# Patient Record
Sex: Female | Born: 1975 | Race: White | Hispanic: No | Marital: Married | Smoking: Never smoker
Health system: Southern US, Community
[De-identification: ages and names within clinical notes are randomized; demographics above are authoritative.]

## PROBLEM LIST (undated history)

## (undated) DIAGNOSIS — C801 Malignant (primary) neoplasm, unspecified: Secondary | ICD-10-CM

## (undated) DIAGNOSIS — J189 Pneumonia, unspecified organism: Secondary | ICD-10-CM

## (undated) DIAGNOSIS — F419 Anxiety disorder, unspecified: Secondary | ICD-10-CM

## (undated) DIAGNOSIS — E039 Hypothyroidism, unspecified: Secondary | ICD-10-CM

## (undated) DIAGNOSIS — D229 Melanocytic nevi, unspecified: Secondary | ICD-10-CM

## (undated) DIAGNOSIS — M1990 Unspecified osteoarthritis, unspecified site: Secondary | ICD-10-CM

## (undated) HISTORY — PX: SLEEVE GASTROPLASTY: SHX1101

## (undated) HISTORY — PX: ABDOMINAL HYSTERECTOMY: SHX81

## (undated) HISTORY — PX: CHOLECYSTECTOMY: SHX55

## (undated) HISTORY — PX: TENDON REPAIR: SHX5111

## (undated) HISTORY — PX: I & D EXTREMITY: SHX5045

## (undated) HISTORY — PX: NECK SURGERY: SHX720

## (undated) HISTORY — PX: HERNIA REPAIR: SHX51

## (undated) HISTORY — PX: GASTRIC BYPASS: SHX52

---
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
## 2002-04-28 DIAGNOSIS — D229 Melanocytic nevi, unspecified: Secondary | ICD-10-CM

## 2002-04-28 HISTORY — DX: Melanocytic nevi, unspecified: D22.9

## 2010-11-06 ENCOUNTER — Other Ambulatory Visit: Payer: Self-pay | Admitting: Rheumatology

## 2010-11-06 ENCOUNTER — Ambulatory Visit (HOSPITAL_COMMUNITY)
Admission: RE | Admit: 2010-11-06 | Discharge: 2010-11-06 | Disposition: A | Discharge status: Home / Self Care | Payer: Medicare (Managed Care) | Source: Ambulatory Visit | Attending: Rheumatology | Admitting: Rheumatology

## 2010-11-06 DIAGNOSIS — R52 Pain, unspecified: Secondary | ICD-10-CM

## 2019-05-02 ENCOUNTER — Encounter: Payer: Self-pay | Admitting: *Deleted

## 2020-10-31 ENCOUNTER — Ambulatory Visit: Payer: Medicare (Managed Care) | Admitting: Physician Assistant

## 2020-11-01 ENCOUNTER — Ambulatory Visit: Payer: Medicare (Managed Care) | Admitting: Physician Assistant

## 2020-11-06 ENCOUNTER — Ambulatory Visit: Payer: Medicare (Managed Care) | Admitting: Physician Assistant

## 2020-12-26 ENCOUNTER — Encounter (HOSPITAL_COMMUNITY): Payer: Self-pay | Admitting: Orthopedic Surgery

## 2020-12-26 NOTE — H&P (Signed)
TOTAL KNEE ADMISSION H&P  Patient is being admitted for right total knee arthroplasty.  Subjective:  Chief Complaint: Right knee pain.  HPI: Isabella Moore, 45 y.o. female has a history of pain and functional disability in the right knee due to arthritis and has failed non-surgical conservative treatments for greater than 12 weeks to include corticosteriod injections and activity modification. Onset of symptoms was gradual, starting >10 years ago with gradually worsening course since that time. The patient noted no past surgery on the right knee.  Patient currently rates pain in the right knee at 7 out of 10 with activity. Patient has worsening of pain with activity and weight bearing, pain that interferes with activities of daily living, pain with passive range of motion and crepitus. Patient has evidence of bone-on-bone arthritis in the medial compartment and near bone-on-bone in the patellofemoral compartment by imaging studies. There is no active infection.  There are no problems to display for this patient.   Past Medical History:  Diagnosis Date  . 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 Atypical nevus 05/15/2011   moderate-lower back  . Atypical nevus 06/23/2013   mild- upper right back  . Atypical nevus 06/23/2013   moderate-lower right back,lower stomach  . Atypical nevus 08/02/2014   mild-left jawline, right back, left mid back, left lower mid back  . Atypical nevus 03/14/2015   moderate-left inner thigh  . Atypical nevus 12/13/2015   mild-right side superior  . Atypical nevus 07/30/2016   moderate-lower abdomen    History reviewed. No pertinent surgical history.  Prior to Admission medications   Medication Sig Start Date End Date Taking? Authorizing Provider  Calcium Carbonate (CALCI-CHEW PO) Take 1 tablet by mouth in the morning and at bedtime.   Yes [provider]   Cyanocobalamin (B-12 PO) Place 1 drop under the tongue in the morning and at bedtime.   Yes [provider]  diphenhydrAMINE (BENADRYL) 25 mg capsule Take 25 mg by mouth every 6 (six) hours as needed for allergies.   Yes [provider]  DULoxetine (CYMBALTA) 60 MG capsule Take 60 mg by mouth every morning. 11/27/20  Yes [provider]  furosemide (LASIX) 80 MG tablet Take 80 mg by mouth 2 (two) times daily. 12/07/20  Yes [provider]  ibuprofen (ADVIL) 800 MG tablet Take 800 mg by mouth 2 (two) times daily as needed for pain. 12/10/20  Yes [provider]  Multiple Vitamins-Minerals (BARIATRIC FUSION PO) Take 1 tablet by mouth in the morning and at bedtime.   Yes [provider]  PARoxetine (PAXIL) 40 MG tablet Take 10 mg by mouth at bedtime. 0.25 tablet   Yes [provider]  pregabalin (LYRICA) 50 MG capsule Take 50 mg by mouth 2 (two) times daily. 11/24/20  Yes [provider]  promethazine (PHENERGAN) 25 MG tablet Take 25 mg by mouth every 6 (six) hours as needed for nausea/vomiting. 08/03/20  Yes [provider]  Pyridoxine HCl (B-6 PO) Place 1 drop under the tongue in the morning and at bedtime.   Yes [provider]  rOPINIRole (REQUIP) 2 MG tablet Take 2 mg by mouth in the morning, at noon, and at bedtime. 08/06/20  Yes [provider]  spironolactone (ALDACTONE) 50 MG tablet Take 50 mg by mouth in the morning. 12/01/20  Yes [provider]  tiZANidine (ZANAFLEX) 4 MG tablet Take 4 mg  by mouth in the morning, at noon, and at bedtime. 11/21/20  Yes [provider]  traMADol (ULTRAM) 50 MG tablet Take 50 mg by mouth every 6 (six) hours as needed (pain).   Yes [provider]  LINZESS 290 MCG CAPS capsule Take 290 mcg by mouth in the morning. 11/04/20   [provider]  ropinirole (REQUIP) 5 MG tablet Take 5 mg by mouth in the morning, at noon, and at bedtime. 12/08/20    [provider]    Allergies  Allergen Reactions  . Codeine Hives, Nausea And Vomiting, Rash and Other (See Comments)    hallucinations   . Latex Hives and Rash  . Morphine Hives, Rash and Other (See Comments)    hallucinations   . Pork Allergy Anaphylaxis  . Ondansetron Other (See Comments)    Severe headaches   . Wound Dressing Adhesive Rash    Social History   Socioeconomic History  . Marital status: Married    Spouse name: Not on file  . Number of children: Not on file  . Years of education: Not on file  . Highest education level: Not on file  Occupational History  . Not on file  Tobacco Use  . Smoking status: Not on file  . Smokeless tobacco: Not on file  Substance and Sexual Activity  . Alcohol use: Not on file  . Drug use: Not on file  . Sexual activity: Not on file  Other Topics Concern  . Not on file  Social History Narrative  . Not on file   Social Determinants of Health   Financial Resource Strain: Not on file  Food Insecurity: Not on file  Transportation Needs: Not on file  Physical Activity: Not on file  Stress: Not on file  Social Connections: Not on file  Intimate Partner Violence: Not on file    Tobacco Use: Not on file   Social History   Substance and Sexual Activity  Alcohol Use None    History reviewed. No pertinent family history.  Review of Systems  Constitutional: Negative for chills and fever.  HENT: Negative for congestion, sore throat and tinnitus.   Eyes: Negative for double vision, photophobia and pain.  Respiratory: Negative for cough, shortness of breath and wheezing.   Cardiovascular: Negative for chest pain, palpitations and orthopnea.  Gastrointestinal: Negative for heartburn, nausea and vomiting.  Genitourinary: Negative for dysuria, frequency and urgency.  Musculoskeletal: Positive for joint pain.  Neurological: Negative for dizziness, weakness and headaches.    Objective:  Physical Exam: Well  nourished and well developed.  General: Alert and oriented x3, cooperative and pleasant, no acute distress.  Head: normocephalic, atraumatic, neck supple.  Eyes: EOMI.  Respiratory: breath sounds clear in all fields, no wheezing, rales, or rhonchi. Cardiovascular: Regular rate and rhythm, no murmurs, gallops or rubs.  Abdomen: non-tender to palpation and soft, normoactive bowel sounds. Musculoskeletal:  Right Knee Exam:  Trace effusion present. No swelling present.  The range of motion is: 5 to 110 degrees actively, I can push her further than 110 degrees, but she does have significant pain with it.  No crepitus on range of motion of the knee.  Slight medial joint line tenderness.  No lateral joint line tenderness.  The knee is stable.   Calves soft and nontender. Motor function intact in LE. Strength 5/5 LE bilaterally. Neuro: Distal pulses 2+. Sensation to light touch intact in LE.  Imaging Review Plain radiographs demonstrate severe degenerative joint disease of the  right knee. The overall alignment is neutral. The bone quality appears to be adequate for age and reported activity level.  Assessment/Plan:  End stage arthritis, right knee   The patient history, physical examination, clinical judgment of the provider and imaging studies are consistent with end stage degenerative joint disease of the right knee and total knee arthroplasty is deemed medically necessary. The treatment options including medical management, injection therapy arthroscopy and arthroplasty were discussed at length. The risks and benefits of total knee arthroplasty were presented and reviewed. The risks due to aseptic loosening, infection, stiffness, patella tracking problems, thromboembolic complications and other imponderables were discussed. The patient acknowledged the explanation, agreed to proceed with the plan and consent was signed. Patient is being admitted for inpatient treatment for surgery, pain  control, PT, OT, prophylactic antibiotics, VTE prophylaxis, progressive ambulation and ADLs and discharge planning. The patient is planning to be discharged home.   Patient's anticipated LOS is less than 2 midnights, meeting these requirements: - Younger than 19 - Lives within 1 hour of care - Has a competent adult at home to recover with post-op recover - NO history of  - Chronic pain requiring opiods  - Diabetes  - Coronary Artery Disease  - Heart failure  - Heart attack  - Stroke  - DVT/VTE  - Cardiac arrhythmia  - Respiratory Failure/COPD  - Renal failure  - Anemia  - Advanced Liver disease  Therapy Plans: Outpatient therapy at [facility] Disposition: Home with husband Planned DVT Prophylaxis: Xarelto 10 mg QD DME Needed: Gil[facility] Rile PCP: Allie Dimmer, MD (clearance received) TXA: IV Allergies: Codeine (hives), morphine (angioedema, hallucinations), zofran (headache) Anesthesia Concerns: None BMI: 32.3 Last HgbA1c: Not diabetic [facility]acy: [facility] [place])  Other:  - Tolerates tramadol and dilaudid - Hx gastric bypass   - Patient was instructed on what medications to stop prior to surgery. - Follow-up visit in 2 weeks with Dr. Wynelle Link - Begin physical therapy following surgery - Pre-operative lab work as pre-surgical testing - Prescriptions will be provided in hospital at time of discharge  Theresa Duty, PA-C Orthopedic Surgery [facility] [place]

## 2020-12-27 NOTE — Patient Instructions (Signed)
DUE TO COVID-19 ONLY ONE VISITOR IS ALLOWED TO COME WITH YOU AND STAY IN THE WAITING ROOM ONLY DURING PRE OP AND PROCEDURE DAY OF SURGERY. THE 1 VISITOR  MAY VISIT WITH YOU AFTER SURGERY IN YOUR PRIVATE ROOM DURING VISITING HOURS ONLY!  YOU NEED TO HAVE A COVID 19 TEST ON: 12/28/20 @ 11:35 AM, THIS TEST MUST BE DONE BEFORE SURGERY,  COVID TESTING SITE [place] [address], IT IS ON THE RIGHT GOING OUT [address] APPROXIMATELY  2 MINUTES PAST [employer] ON THE RIGHT. ONCE YOUR COVID TEST IS COMPLETED,  PLEASE BEGIN THE QUARANTINE INSTRUCTIONS AS OUTLINED IN YOUR HANDOUT.                Isabella Moore   Your procedure is scheduled on: 12/31/20    Report to [facility] Main  Entrance   Report to admitting at: 12:20 PM     Call this number if you have problems the morning of surgery [phone]    Remember:  NO SOLID FOOD AFTER MIDNIGHT THE NIGHT PRIOR TO SURGERY. NOTHING BY MOUTH EXCEPT CLEAR LIQUIDS UNTIL: 11:50 AM . PLEASE FINISH ENSURE DRINK PER SURGEON ORDER  WHICH NEEDS TO BE COMPLETED AT: 11:50 AM .  CLEAR LIQUID DIET  Foods Allowed                                                                     Foods Excluded  Coffee and tea, regular and decaf                             liquids that you cannot  Plain Jell-O any favor except red or purple                                           see through such as: Fruit ices (not with fruit pulp)                                     milk, soups, orange juice  Iced Popsicles                                    All solid food Carbonated beverages, regular and diet                                    Cranberry, grape and apple juices Sports drinks like Gatorade Lightly seasoned clear broth or consume(fat free) Sugar, honey syrup  Sample Menu Breakfast                                Lunch  Supper Cranberry juice                    Beef broth                             Chicken broth Jell-O                                     Grape juice                           Apple juice Coffee or tea                        Jell-O                                      Popsicle                                                Coffee or tea                        Coffee or tea  _____________________________________________________________________   BRUSH YOUR TEETH MORNING OF SURGERY AND RINSE YOUR MOUTH OUT, NO CHEWING GUM CANDY OR MINTS.    Take these medicines the morning of surgery with A SIP OF WATER: Duloxetine,pregabalin,[place].                               You may not have any metal on your body including hair pins and              piercings  Do not wear jewelry, make-up, lotions, powders or perfumes, deodorant             Do not wear nail polish on your fingernails.  Do not shave  48 hours prior to surgery.    Do not bring valuables to the hospital. [place].  Contacts, dentures or bridgework may not be worn into surgery.  Leave suitcase in the car. After surgery it may be brought to your room.    Patients discharged the day of surgery will not be allowed to drive home. IF YOU ARE HAVING SURGERY AND GOING HOME THE SAME DAY, YOU MUST HAVE AN ADULT TO DRIVE YOU HOME AND BE WITH YOU FOR 24 HOURS. YOU MAY GO HOME BY TAXI OR UBER OR ORTHERWISE, BUT AN ADULT MUST ACCOMPANY YOU HOME AND STAY WITH YOU FOR 24 HOURS.  Name and phone number of your driver:  Special Instructions: N/A              Please read over the following fact sheets you were given: _____________________________________________________________________          [facility] - Preparing for Surgery Before surgery, you can play an important role.  Because skin is not sterile, your skin needs to be as free of germs as possible.  You can  reduce the number of germs on your skin by washing with CHG (chlorahexidine gluconate) soap before surgery.  CHG is an  antiseptic cleaner which kills germs and bonds with the skin to continue killing germs even after washing. Please DO NOT use if you have an allergy to CHG or antibacterial soaps.  If your skin becomes reddened/irritated stop using the CHG and inform your nurse when you arrive at Short Stay. Do not shave (including legs and underarms) for at least 48 hours prior to the first CHG shower.  You may shave your face/neck. Please follow these instructions carefully:  1.  Shower with CHG Soap the night before surgery and the  morning of Surgery.  2.  If you choose to wash your hair, wash your hair first as usual with your  normal  shampoo.  3.  After you shampoo, rinse your hair and body thoroughly to remove the  shampoo.                           4.  Use CHG as you would any other liquid soap.  You can apply chg directly  to the skin and wash                       Gently with a scrungie or clean washcloth.  5.  Apply the CHG Soap to your body ONLY FROM THE NECK DOWN.   Do not use on face/ open                           Wound or open sores. Avoid contact with eyes, ears mouth and genitals (private parts).                       Wash face,  Genitals (private parts) with your normal soap.             6.  Wash thoroughly, paying special attention to the area where your surgery  will be performed.  7.  Thoroughly rinse your body with warm water from the neck down.  8.  DO NOT shower/wash with your normal soap after using and rinsing off  the CHG Soap.                9.  Pat yourself dry with a clean towel.            10.  Wear clean pajamas.            11.  Place clean sheets on your bed the night of your first shower and do not  sleep with pets. Day of Surgery : Do not apply any lotions/deodorants the morning of surgery.  Please wear clean clothes to the hospital/surgery center.  FAILURE TO FOLLOW THESE INSTRUCTIONS MAY RESULT IN THE CANCELLATION OF YOUR SURGERY PATIENT  SIGNATURE_________________________________  NURSE SIGNATURE__________________________________  ________________________________________________________________________   Isabella Moore  An incentive spirometer is a tool that can help keep your lungs clear and active. This tool measures how well you are filling your lungs with each breath. Taking long deep breaths may help reverse or decrease the chance of developing breathing (pulmonary) problems (especially infection) following:  A long period of time when you are unable to move or be active. BEFORE THE PROCEDURE   If the spirometer includes an indicator to show your best effort, your nurse or respiratory therapist will set it  to a desired goal.  If possible, sit up straight or lean slightly forward. Try not to slouch.  Hold the incentive spirometer in an upright position. INSTRUCTIONS FOR USE  1. Sit on the edge of your bed if possible, or sit up as far as you can in bed or on a chair. 2. Hold the incentive spirometer in an upright position. 3. Breathe out normally. 4. Place the mouthpiece in your mouth and seal your lips tightly around it. 5. Breathe in slowly and as deeply as possible, raising the piston or the ball toward the top of the column. 6. Hold your breath for 3-5 seconds or for as long as possible. Allow the piston or ball to fall to the bottom of the column. 7. Remove the mouthpiece from your mouth and breathe out normally. 8. Rest for a few seconds and repeat Steps 1 through 7 at least 10 times every 1-2 hours when you are awake. Take your time and take a few normal breaths between deep breaths. 9. The spirometer may include an indicator to show your best effort. Use the indicator as a goal to work toward during each repetition. 10. After each set of 10 deep breaths, practice coughing to be sure your lungs are clear. If you have an incision (the cut made at the time of surgery), support your incision when coughing  by placing a pillow or rolled up towels firmly against it. Once you are able to get out of bed, walk around indoors and cough well. You may stop using the incentive spirometer when instructed by your caregiver.  RISKS AND COMPLICATIONS  Take your time so you do not get dizzy or light-headed.  If you are in pain, you may need to take or ask for pain medication before doing incentive spirometry. It is harder to take a deep breath if you are having pain. AFTER USE  Rest and breathe slowly and easily.  It can be helpful to keep track of a log of your progress. Your caregiver can provide you with a simple table to help with this. If you are using the spirometer at home, follow these instructions: [place] IF:   You are having difficultly using the spirometer.  You have trouble using the spirometer as often as instructed.  Your pain medication is not giving enough relief while using the spirometer.  You develop fever of 100.5 F (38.1 C) or higher. SEEK IMMEDIATE MEDICAL CARE IF:   You cough up bloody sputum that had not been present before.  You develop fever of 102 F (38.9 C) or greater.  You develop worsening pain at or near the incision site. MAKE SURE YOU:   Understand these instructions.  Will watch your condition.  Will get help right away if you are not doing well or get worse. Document Released: 02/02/2007 Document Revised: 12/15/2011 Document Reviewed: 04/05/2007 [facility] Patient Information 2014 [place], [place].   ________________________________________________________________________

## 2020-12-28 ENCOUNTER — Other Ambulatory Visit (HOSPITAL_COMMUNITY)
Admission: RE | Admit: 2020-12-28 | Discharge: 2020-12-28 | Disposition: A | Discharge status: Home / Self Care | Payer: Medicare HMO | Source: Ambulatory Visit | Attending: Orthopedic Surgery | Admitting: Orthopedic Surgery

## 2020-12-28 ENCOUNTER — Encounter (HOSPITAL_COMMUNITY): Payer: Self-pay

## 2020-12-28 ENCOUNTER — Encounter (HOSPITAL_COMMUNITY)
Admission: RE | Admit: 2020-12-28 | Discharge: 2020-12-28 | Disposition: A | Discharge status: Home / Self Care | Payer: Medicare HMO | Source: Ambulatory Visit | Attending: Orthopedic Surgery | Admitting: Orthopedic Surgery

## 2020-12-28 ENCOUNTER — Other Ambulatory Visit: Payer: Self-pay

## 2020-12-28 DIAGNOSIS — Z01812 Encounter for preprocedural laboratory examination: Secondary | ICD-10-CM | POA: Diagnosis not present

## 2020-12-28 DIAGNOSIS — Z20822 Contact with and (suspected) exposure to covid-19: Secondary | ICD-10-CM | POA: Diagnosis not present

## 2020-12-28 HISTORY — DX: Anxiety disorder, unspecified: F41.9

## 2020-12-28 HISTORY — DX: Hypothyroidism, unspecified: E03.9

## 2020-12-28 HISTORY — DX: Pneumonia, unspecified organism: J18.9

## 2020-12-28 HISTORY — DX: Malignant (primary) neoplasm, unspecified: C80.1

## 2020-12-28 HISTORY — DX: Unspecified osteoarthritis, unspecified site: M19.90

## 2020-12-28 LAB — CBC
HCT: 36.8 % (ref 36.0–46.0)
Hemoglobin: 11.6 g/dL — ABNORMAL LOW (ref 12.0–15.0)
MCH: 28 pg (ref 26.0–34.0)
MCHC: 31.5 g/dL (ref 30.0–36.0)
MCV: 88.7 fL (ref 80.0–100.0)
Platelets: 284 10*3/uL (ref 150–400)
RBC: 4.15 MIL/uL (ref 3.87–5.11)
RDW: 13.8 % (ref 11.5–15.5)
WBC: 9.4 10*3/uL (ref 4.0–10.5)
nRBC: 0 % (ref 0.0–0.2)

## 2020-12-28 LAB — COMPREHENSIVE METABOLIC PANEL
ALT: 17 U/L (ref 0–44)
AST: 18 U/L (ref 15–41)
Albumin: 3.9 g/dL (ref 3.5–5.0)
Alkaline Phosphatase: 63 U/L (ref 38–126)
Anion gap: 9 (ref 5–15)
BUN: 9 mg/dL (ref 6–20)
CO2: 30 mmol/L (ref 22–32)
Calcium: 8.7 mg/dL — ABNORMAL LOW (ref 8.9–10.3)
Chloride: 99 mmol/L (ref 98–111)
Creatinine, Ser: 0.89 mg/dL (ref 0.44–1.00)
GFR, Estimated: >60 mL/min (ref >60)
Glucose, Bld: 97 mg/dL (ref 70–99)
Potassium: 3.3 mmol/L — ABNORMAL LOW (ref 3.5–5.1)
Sodium: 138 mmol/L (ref 135–145)
Total Bilirubin: 0.5 mg/dL (ref 0.3–1.2)
Total Protein: 7.2 g/dL (ref 6.5–8.1)

## 2020-12-28 LAB — SARS CORONAVIRUS 2 (TAT 6-24 HRS): SARS Coronavirus 2: NEGATIVE

## 2020-12-28 LAB — SURGICAL PCR SCREEN
MRSA, PCR: POSITIVE — AB
Staphylococcus aureus: POSITIVE — AB

## 2020-12-28 LAB — PROTIME-INR
INR: 1 (ref 0.8–1.2)
Prothrombin Time: 12.5 seconds (ref 11.4–15.2)

## 2020-12-28 LAB — APTT: aPTT: 31 seconds (ref 24–36)

## 2020-12-28 NOTE — Progress Notes (Signed)
PCR: POSITIVE : MRSA

## 2020-12-28 NOTE — Progress Notes (Signed)
COVID Vaccine Completed: NO Date COVID Vaccine completed: COVID vaccine manufacturer: Anthony   PCP - Dr. Allie Dimmer: clearance: 12/25/20: Chart Cardiologist -   Chest x-ray -  EKG - 12/26/20: Chart Stress Test -  ECHO -  Cardiac Cath -  Pacemaker/ICD device last checked:  Sleep Study - Yes CPAP - No  Fasting Blood Sugar -  Checks Blood Sugar _____ times a day  Blood Thinner Instructions: Aspirin Instructions: Last Dose:  Anesthesia review:   Patient denies shortness of breath, fever, cough and chest pain at PAT appointment   Patient verbalized understanding of instructions that were given to them at the PAT appointment. Patient was also instructed that they will need to review over the PAT instructions again at home before surgery.

## 2020-12-30 MED ORDER — BUPIVACAINE LIPOSOME 1.3 % IJ SUSP
20.0000 mL | INTRAMUSCULAR | Status: DC
  Filled 2020-12-30: qty 20

## 2020-12-30 MED ORDER — VANCOMYCIN HCL IN DEXTROSE 1-5 GM/200ML-% IV SOLN
1000.0000 mg | INTRAVENOUS | Status: AC
  Administered 2020-12-31: 1000 mg via INTRAVENOUS
  Filled 2020-12-30: qty 200

## 2020-12-31 ENCOUNTER — Ambulatory Visit (HOSPITAL_COMMUNITY): Payer: Medicare HMO | Admitting: Anesthesiology

## 2020-12-31 ENCOUNTER — Observation Stay (HOSPITAL_COMMUNITY)
Admission: RE | Admit: 2020-12-31 | Discharge: 2021-01-01 | Disposition: A | Discharge status: Home / Self Care | Payer: Medicare HMO | Attending: Orthopedic Surgery | Admitting: Orthopedic Surgery

## 2020-12-31 ENCOUNTER — Other Ambulatory Visit: Payer: Self-pay

## 2020-12-31 ENCOUNTER — Encounter (HOSPITAL_COMMUNITY): Payer: Self-pay | Admitting: Orthopedic Surgery

## 2020-12-31 ENCOUNTER — Encounter (HOSPITAL_COMMUNITY)
Admission: RE | Disposition: A | Discharge status: Home / Self Care | Payer: Self-pay | Source: Home / Self Care | Attending: Orthopedic Surgery

## 2020-12-31 DIAGNOSIS — E039 Hypothyroidism, unspecified: Secondary | ICD-10-CM | POA: Insufficient documentation

## 2020-12-31 DIAGNOSIS — M1711 Unilateral primary osteoarthritis, right knee: Principal | ICD-10-CM | POA: Diagnosis present

## 2020-12-31 DIAGNOSIS — M179 Osteoarthritis of knee, unspecified: Secondary | ICD-10-CM | POA: Diagnosis present

## 2020-12-31 DIAGNOSIS — Z85828 Personal history of other malignant neoplasm of skin: Secondary | ICD-10-CM | POA: Diagnosis not present

## 2020-12-31 DIAGNOSIS — Z79899 Other long term (current) drug therapy: Secondary | ICD-10-CM | POA: Insufficient documentation

## 2020-12-31 DIAGNOSIS — M1710 Unilateral primary osteoarthritis, unspecified knee: Secondary | ICD-10-CM | POA: Diagnosis present

## 2020-12-31 HISTORY — PX: TOTAL KNEE ARTHROPLASTY: SHX125

## 2020-12-31 LAB — TYPE AND SCREEN
ABO/RH(D): A POS
Antibody Screen: NEGATIVE

## 2020-12-31 LAB — ABO/RH: ABO/RH(D): A POS

## 2020-12-31 SURGERY — ARTHROPLASTY, KNEE, TOTAL
Anesthesia: Spinal | Site: Knee | Laterality: Right

## 2020-12-31 MED ORDER — POLYETHYLENE GLYCOL 3350 17 G PO PACK: 17.0000 g | PACK | Freq: Every day | ORAL | Status: DC | PRN

## 2020-12-31 MED ORDER — MIDAZOLAM HCL 5 MG/5ML IJ SOLN
INTRAMUSCULAR | Status: DC | PRN
  Administered 2020-12-31 (×2): 2 mg via INTRAVENOUS

## 2020-12-31 MED ORDER — FENTANYL CITRATE (PF) 100 MCG/2ML IJ SOLN
25.0000 ug | INTRAMUSCULAR | Status: DC | PRN
  Administered 2020-12-31 (×3): 25 ug via INTRAVENOUS
  Administered 2020-12-31: 50 ug via INTRAVENOUS

## 2020-12-31 MED ORDER — TRANEXAMIC ACID-NACL 1000-0.7 MG/100ML-% IV SOLN
1000.0000 mg | INTRAVENOUS | Status: AC
  Administered 2020-12-31: 1000 mg via INTRAVENOUS
  Filled 2020-12-31: qty 100

## 2020-12-31 MED ORDER — BUPIVACAINE LIPOSOME 1.3 % IJ SUSP
INTRAMUSCULAR | Status: DC | PRN
  Administered 2020-12-31: 20 mL

## 2020-12-31 MED ORDER — HYDROMORPHONE HCL 1 MG/ML IJ SOLN: 0.5000 mg | INTRAMUSCULAR | Status: DC | PRN

## 2020-12-31 MED ORDER — CEFAZOLIN SODIUM-DEXTROSE 2-4 GM/100ML-% IV SOLN: 2.0000 g | Freq: Once | INTRAVENOUS | Status: DC

## 2020-12-31 MED ORDER — HYDROMORPHONE HCL 2 MG PO TABS
2.0000 mg | ORAL_TABLET | ORAL | Status: DC | PRN
  Administered 2020-12-31: 4 mg via ORAL
  Administered 2020-12-31: 2 mg via ORAL
  Administered 2021-01-01 (×3): 4 mg via ORAL
  Filled 2020-12-31: qty 2
  Filled 2020-12-31: qty 1
  Filled 2020-12-31 (×3): qty 2

## 2020-12-31 MED ORDER — CLONIDINE HCL (ANALGESIA) 100 MCG/ML EP SOLN
EPIDURAL | Status: DC | PRN
  Administered 2020-12-31: 100 ug

## 2020-12-31 MED ORDER — METOCLOPRAMIDE HCL 5 MG PO TABS: 5.0000 mg | ORAL_TABLET | Freq: Three times a day (TID) | ORAL | Status: DC | PRN

## 2020-12-31 MED ORDER — ACETAMINOPHEN 500 MG PO TABS
1000.0000 mg | ORAL_TABLET | Freq: Four times a day (QID) | ORAL | Status: DC
  Administered 2020-12-31 – 2021-01-01 (×3): 1000 mg via ORAL
  Filled 2020-12-31 (×3): qty 2

## 2020-12-31 MED ORDER — ACETAMINOPHEN 10 MG/ML IV SOLN
1000.0000 mg | Freq: Four times a day (QID) | INTRAVENOUS | Status: DC
  Administered 2020-12-31: 1000 mg via INTRAVENOUS
  Filled 2020-12-31: qty 100

## 2020-12-31 MED ORDER — PREGABALIN 50 MG PO CAPS
50.0000 mg | ORAL_CAPSULE | Freq: Two times a day (BID) | ORAL | Status: DC
  Administered 2020-12-31 – 2021-01-01 (×2): 50 mg via ORAL
  Filled 2020-12-31 (×2): qty 1

## 2020-12-31 MED ORDER — ROPINIROLE HCL 1 MG PO TABS
2.0000 mg | ORAL_TABLET | Freq: Three times a day (TID) | ORAL | Status: DC
Start: 2020-12-31 — End: 2021-01-01
  Administered 2020-12-31 – 2021-01-01 (×3): 2 mg via ORAL
  Filled 2020-12-31 (×3): qty 2

## 2020-12-31 MED ORDER — METHOCARBAMOL 500 MG IVPB - SIMPLE MED
INTRAVENOUS | Status: AC
  Administered 2020-12-31: 500 mg via INTRAVENOUS
  Filled 2020-12-31: qty 50

## 2020-12-31 MED ORDER — OXYCODONE HCL 5 MG PO TABS: 5.0000 mg | ORAL_TABLET | Freq: Once | ORAL | Status: DC | PRN

## 2020-12-31 MED ORDER — METOCLOPRAMIDE HCL 5 MG/ML IJ SOLN
5.0000 mg | Freq: Three times a day (TID) | INTRAMUSCULAR | Status: DC | PRN
  Administered 2020-12-31: 10 mg via INTRAVENOUS
  Filled 2020-12-31: qty 2

## 2020-12-31 MED ORDER — ORAL CARE MOUTH RINSE: 15.0000 mL | Freq: Once | OROMUCOSAL | Status: AC

## 2020-12-31 MED ORDER — METHOCARBAMOL 500 MG IVPB - SIMPLE MED
500.0000 mg | Freq: Four times a day (QID) | INTRAVENOUS | Status: DC | PRN
  Filled 2020-12-31: qty 50

## 2020-12-31 MED ORDER — ROPIVACAINE HCL 7.5 MG/ML IJ SOLN
INTRAMUSCULAR | Status: DC | PRN
  Administered 2020-12-31: 20 mL via PERINEURAL

## 2020-12-31 MED ORDER — CHLORHEXIDINE GLUCONATE 0.12 % MT SOLN
15.0000 mL | Freq: Once | OROMUCOSAL | Status: AC
  Administered 2020-12-31: 15 mL via OROMUCOSAL

## 2020-12-31 MED ORDER — TIZANIDINE HCL 4 MG PO TABS
4.0000 mg | ORAL_TABLET | Freq: Four times a day (QID) | ORAL | Status: DC | PRN
  Administered 2021-01-01: 4 mg via ORAL
  Filled 2020-12-31: qty 1

## 2020-12-31 MED ORDER — DULOXETINE HCL 60 MG PO CPEP
60.0000 mg | ORAL_CAPSULE | Freq: Every morning | ORAL | Status: DC
  Administered 2021-01-01: 60 mg via ORAL
  Filled 2020-12-31: qty 1

## 2020-12-31 MED ORDER — MENTHOL 3 MG MT LOZG: 1.0000 | LOZENGE | OROMUCOSAL | Status: DC | PRN

## 2020-12-31 MED ORDER — CEFAZOLIN SODIUM-DEXTROSE 2-4 GM/100ML-% IV SOLN
2.0000 g | Freq: Four times a day (QID) | INTRAVENOUS | Status: AC
Start: 2020-12-31 — End: 2021-01-01
  Administered 2020-12-31 – 2021-01-01 (×2): 2 g via INTRAVENOUS
  Filled 2020-12-31 (×2): qty 100

## 2020-12-31 MED ORDER — FENTANYL CITRATE (PF) 100 MCG/2ML IJ SOLN
INTRAMUSCULAR | Status: AC
  Filled 2020-12-31: qty 2

## 2020-12-31 MED ORDER — PROPOFOL 10 MG/ML IV BOLUS
INTRAVENOUS | Status: AC
  Filled 2020-12-31: qty 20

## 2020-12-31 MED ORDER — MIDAZOLAM HCL 2 MG/2ML IJ SOLN
1.0000 mg | INTRAMUSCULAR | Status: DC
  Administered 2020-12-31: 2 mg via INTRAVENOUS
  Filled 2020-12-31: qty 2

## 2020-12-31 MED ORDER — LACTATED RINGERS IV SOLN: INTRAVENOUS | Status: DC

## 2020-12-31 MED ORDER — OXYCODONE HCL 5 MG/5ML PO SOLN: 5.0000 mg | Freq: Once | ORAL | Status: DC | PRN

## 2020-12-31 MED ORDER — FENTANYL CITRATE (PF) 100 MCG/2ML IJ SOLN
INTRAMUSCULAR | Status: DC | PRN
  Administered 2020-12-31 (×2): 50 ug via INTRAVENOUS

## 2020-12-31 MED ORDER — FENTANYL CITRATE (PF) 100 MCG/2ML IJ SOLN
50.0000 ug | Freq: Once | INTRAMUSCULAR | Status: AC
  Administered 2020-12-31: 100 ug via INTRAVENOUS
  Filled 2020-12-31: qty 2

## 2020-12-31 MED ORDER — FLEET ENEMA 7-19 GM/118ML RE ENEM: 1.0000 | ENEMA | Freq: Once | RECTAL | Status: DC | PRN

## 2020-12-31 MED ORDER — SODIUM CHLORIDE 0.9 % IV SOLN: INTRAVENOUS | Status: DC

## 2020-12-31 MED ORDER — PROMETHAZINE HCL 25 MG/ML IJ SOLN: 6.2500 mg | INTRAMUSCULAR | Status: DC | PRN

## 2020-12-31 MED ORDER — TRAMADOL HCL 50 MG PO TABS
50.0000 mg | ORAL_TABLET | Freq: Four times a day (QID) | ORAL | Status: DC | PRN
  Administered 2020-12-31 – 2021-01-01 (×3): 50 mg via ORAL
  Filled 2020-12-31 (×3): qty 1

## 2020-12-31 MED ORDER — PHENOL 1.4 % MT LIQD: 1.0000 | OROMUCOSAL | Status: DC | PRN

## 2020-12-31 MED ORDER — MIDAZOLAM HCL 2 MG/2ML IJ SOLN
INTRAMUSCULAR | Status: AC
  Filled 2020-12-31: qty 2

## 2020-12-31 MED ORDER — PROMETHAZINE HCL 25 MG PO TABS: 25.0000 mg | ORAL_TABLET | Freq: Four times a day (QID) | ORAL | Status: DC | PRN

## 2020-12-31 MED ORDER — DIPHENHYDRAMINE HCL 25 MG PO CAPS
25.0000 mg | ORAL_CAPSULE | Freq: Four times a day (QID) | ORAL | Status: DC | PRN
  Administered 2021-01-01: 25 mg via ORAL
  Filled 2020-12-31: qty 1

## 2020-12-31 MED ORDER — SODIUM CHLORIDE (PF) 0.9 % IJ SOLN
INTRAMUSCULAR | Status: DC | PRN
  Administered 2020-12-31: 60 mL

## 2020-12-31 MED ORDER — BISACODYL 10 MG RE SUPP: 10.0000 mg | Freq: Every day | RECTAL | Status: DC | PRN

## 2020-12-31 MED ORDER — PROPOFOL 10 MG/ML IV BOLUS
INTRAVENOUS | Status: DC | PRN
Start: 2020-12-31 — End: 2020-12-31
  Administered 2020-12-31: 20 mg via INTRAVENOUS
  Administered 2020-12-31: 30 mg via INTRAVENOUS

## 2020-12-31 MED ORDER — DOCUSATE SODIUM 100 MG PO CAPS
100.0000 mg | ORAL_CAPSULE | Freq: Two times a day (BID) | ORAL | Status: DC
  Administered 2020-12-31 – 2021-01-01 (×2): 100 mg via ORAL
  Filled 2020-12-31 (×2): qty 1

## 2020-12-31 MED ORDER — DIPHENHYDRAMINE HCL 12.5 MG/5ML PO ELIX: 12.5000 mg | ORAL_SOLUTION | ORAL | Status: DC | PRN

## 2020-12-31 MED ORDER — SODIUM CHLORIDE 0.9 % IR SOLN
Status: DC | PRN
  Administered 2020-12-31: 3000 mL

## 2020-12-31 MED ORDER — PHENYLEPHRINE 40 MCG/ML (10ML) SYRINGE FOR IV PUSH (FOR BLOOD PRESSURE SUPPORT)
PREFILLED_SYRINGE | INTRAVENOUS | Status: DC | PRN
  Administered 2020-12-31 (×2): 120 ug via INTRAVENOUS

## 2020-12-31 MED ORDER — CEFAZOLIN SODIUM-DEXTROSE 2-4 GM/100ML-% IV SOLN
2.0000 g | INTRAVENOUS | Status: AC
  Administered 2020-12-31: 2 g via INTRAVENOUS
  Filled 2020-12-31: qty 100

## 2020-12-31 MED ORDER — BUPIVACAINE IN DEXTROSE 0.75-8.25 % IT SOLN
INTRATHECAL | Status: DC | PRN
  Administered 2020-12-31: 1.5 mL via INTRATHECAL

## 2020-12-31 MED ORDER — PHENYLEPHRINE 40 MCG/ML (10ML) SYRINGE FOR IV PUSH (FOR BLOOD PRESSURE SUPPORT)
PREFILLED_SYRINGE | INTRAVENOUS | Status: AC
  Filled 2020-12-31: qty 10

## 2020-12-31 MED ORDER — RIVAROXABAN 10 MG PO TABS
10.0000 mg | ORAL_TABLET | Freq: Every day | ORAL | Status: DC
  Administered 2021-01-01: 10 mg via ORAL
  Filled 2020-12-31: qty 1

## 2020-12-31 MED ORDER — PROPOFOL 500 MG/50ML IV EMUL
INTRAVENOUS | Status: AC
  Filled 2020-12-31: qty 100

## 2020-12-31 MED ORDER — METHOCARBAMOL 500 MG PO TABS
500.0000 mg | ORAL_TABLET | Freq: Four times a day (QID) | ORAL | Status: DC | PRN
  Administered 2021-01-01 (×2): 500 mg via ORAL
  Filled 2020-12-31 (×2): qty 1

## 2020-12-31 MED ORDER — FUROSEMIDE 40 MG PO TABS
80.0000 mg | ORAL_TABLET | Freq: Two times a day (BID) | ORAL | Status: DC
  Administered 2021-01-01: 80 mg via ORAL
  Filled 2020-12-31: qty 2

## 2020-12-31 MED ORDER — PROPOFOL 500 MG/50ML IV EMUL
INTRAVENOUS | Status: DC | PRN
  Administered 2020-12-31: 75 ug/kg/min via INTRAVENOUS

## 2020-12-31 MED ORDER — DEXAMETHASONE SODIUM PHOSPHATE 10 MG/ML IJ SOLN
8.0000 mg | Freq: Once | INTRAMUSCULAR | Status: AC
  Administered 2020-12-31: 8 mg via INTRAVENOUS

## 2020-12-31 MED ORDER — DEXAMETHASONE SODIUM PHOSPHATE 10 MG/ML IJ SOLN
10.0000 mg | Freq: Once | INTRAMUSCULAR | Status: AC
  Administered 2021-01-01: 10 mg via INTRAVENOUS
  Filled 2020-12-31: qty 1

## 2020-12-31 MED ORDER — POVIDONE-IODINE 10 % EX SWAB
2.0000 "application " | Freq: Once | CUTANEOUS | Status: AC
  Administered 2020-12-31: 2 via TOPICAL

## 2020-12-31 MED ORDER — SPIRONOLACTONE 25 MG PO TABS
50.0000 mg | ORAL_TABLET | Freq: Every morning | ORAL | Status: DC
  Administered 2021-01-01: 50 mg via ORAL
  Filled 2020-12-31: qty 2

## 2020-12-31 SURGICAL SUPPLY — 50 items
ATTUNE PS FEM RT SZ 4 CEM KNEE (Femur) ×2 IMPLANT
ATTUNE PSRP INSR SZ4 8 KNEE (Insert) ×2 IMPLANT
BASE TIBIAL ROT PLAT SZ 3 KNEE (Knees) ×1 IMPLANT
BLADE SAG 18X100X1.27 (BLADE) ×2 IMPLANT
BLADE SAW SGTL 11.0X1.19X90.0M (BLADE) ×2 IMPLANT
BNDG ELASTIC 6X10 VLCR STRL LF (GAUZE/BANDAGES/DRESSINGS) ×2 IMPLANT
BOWL SMART MIX CTS (DISPOSABLE) ×2 IMPLANT
CEMENT HV SMART SET (Cement) ×4 IMPLANT
CLSR STERI-STRIP ANTIMIC 1/2X4 (GAUZE/BANDAGES/DRESSINGS) ×2 IMPLANT
COVER SURGICAL LIGHT HANDLE (MISCELLANEOUS) ×2 IMPLANT
CUFF TOURN SGL QUICK 34 (TOURNIQUET CUFF) ×1
CUFF TRNQT CYL 34X4.125X (TOURNIQUET CUFF) ×1 IMPLANT
DECANTER SPIKE VIAL GLASS SM (MISCELLANEOUS) ×2 IMPLANT
DRAPE U-SHAPE 47X51 STRL (DRAPES) ×2 IMPLANT
DRESSING AQUACEL AG SP 3.5X10 (GAUZE/BANDAGES/DRESSINGS) ×1 IMPLANT
DRSG AQUACEL AG SP 3.5X10 (GAUZE/BANDAGES/DRESSINGS) ×2
DURAPREP 26ML APPLICATOR (WOUND CARE) ×2 IMPLANT
ELECT REM PT RETURN 15FT ADLT (MISCELLANEOUS) ×2 IMPLANT
GLOVE SRG 8 PF TXTR STRL LF DI (GLOVE) ×1 IMPLANT
GLOVE SURG ENC MOIS LTX SZ6.5 (GLOVE) ×2 IMPLANT
GLOVE SURG ENC MOIS LTX SZ8 (GLOVE) ×4 IMPLANT
GLOVE SURG UNDER POLY LF SZ6.5 (GLOVE) ×2 IMPLANT
GLOVE SURG UNDER POLY LF SZ8 (GLOVE) ×1
GLOVE SURG UNDER POLY LF SZ8.5 (GLOVE) ×2 IMPLANT
GOWN STRL REUS W/TWL LRG LVL3 (GOWN DISPOSABLE) ×4 IMPLANT
GOWN STRL REUS W/TWL XL LVL3 (GOWN DISPOSABLE) ×2 IMPLANT
HANDPIECE INTERPULSE COAX TIP (DISPOSABLE) ×1
HOLDER FOLEY CATH W/STRAP (MISCELLANEOUS) IMPLANT
IMMOBILIZER KNEE 20 (SOFTGOODS) ×2
IMMOBILIZER KNEE 20 THIGH 36 (SOFTGOODS) ×1 IMPLANT
KIT TURNOVER KIT A (KITS) ×2 IMPLANT
MANIFOLD NEPTUNE II (INSTRUMENTS) ×2 IMPLANT
NS IRRIG 1000ML POUR BTL (IV SOLUTION) ×2 IMPLANT
PACK TOTAL KNEE CUSTOM (KITS) ×2 IMPLANT
PADDING CAST COTTON 6X4 STRL (CAST SUPPLIES) ×4 IMPLANT
PATELLA MEDIAL ATTUN 35MM KNEE (Knees) ×2 IMPLANT
PENCIL SMOKE EVACUATOR (MISCELLANEOUS) ×2 IMPLANT
PIN DRILL FIX HALF THREAD (BIT) ×2 IMPLANT
PIN FIX SIGMA LCS THRD HI (PIN) ×2 IMPLANT
PROTECTOR NERVE ULNAR (MISCELLANEOUS) ×2 IMPLANT
SET HNDPC FAN SPRY TIP SCT (DISPOSABLE) ×1 IMPLANT
SUT MNCRL AB 4-0 PS2 18 (SUTURE) ×2 IMPLANT
SUT STRATAFIX 0 PDS 27 VIOLET (SUTURE) ×2
SUT VIC AB 2-0 CT1 27 (SUTURE) ×3
SUT VIC AB 2-0 CT1 TAPERPNT 27 (SUTURE) ×3 IMPLANT
SUTURE STRATFX 0 PDS 27 VIOLET (SUTURE) ×1 IMPLANT
TIBIAL BASE ROT PLAT SZ 3 KNEE (Knees) ×2 IMPLANT
TRAY FOLEY MTR SLVR 16FR STAT (SET/KITS/TRAYS/PACK) ×2 IMPLANT
WATER STERILE IRR 1000ML POUR (IV SOLUTION) ×4 IMPLANT
WRAP KNEE MAXI GEL POST OP (GAUZE/BANDAGES/DRESSINGS) ×2 IMPLANT

## 2020-12-31 NOTE — Progress Notes (Signed)
Orthopedic Tech Progress Note Patient Details:  Isabella Moore Jul 16, 1976 [id number]  Patient ID: Isabella Moore, female   DOB: Sep 15, 1976, 45 y.o.   MRN: [id number]   Isabella Moore 12/31/2020, 5:08 PM Patient placed in cpm in pacu @1700 

## 2020-12-31 NOTE — Anesthesia Procedure Notes (Signed)
Procedure Name: MAC Date/Time: 12/31/2020 3:05 PM Performed by: Lissa Morales, CRNA Pre-anesthesia Checklist: Patient identified, Emergency Drugs available, Suction available, Patient being monitored and Timeout performed Patient Re-evaluated:Patient Re-evaluated prior to induction Oxygen Delivery Method: Simple face mask Placement Confirmation: positive ETCO2

## 2020-12-31 NOTE — Anesthesia Procedure Notes (Signed)
Spinal  Patient location during procedure: OR End time: 12/31/2020 3:09 PM Reason for block: surgical anesthesia Staffing Performed: resident/CRNA  Resident/CRNA: Lissa Morales, CRNA Preanesthetic Checklist Completed: patient identified, IV checked, site marked, risks and benefits discussed, surgical consent, monitors and equipment checked, pre-op evaluation and timeout performed Spinal Block Patient position: sitting Prep: DuraPrep Patient monitoring: heart rate, continuous pulse ox and blood pressure Approach: midline Location: L3-4 Injection technique: single-shot Needle Needle type: Pencan  Needle gauge: 24 G Needle length: 9 cm Assessment Sensory level: T4 Events: CSF return Additional Notes Expiration date of kit checked and confirmed. Patient tolerated procedure well, without complications.

## 2020-12-31 NOTE — Anesthesia Procedure Notes (Signed)
Anesthesia [facility] Block: Adductor canal block   Pre-Anesthetic Checklist: ,, timeout performed, Correct Patient, Correct Site, Correct Laterality, Correct Procedure, Correct Position, site marked, Risks and benefits discussed,  Surgical consent,  Pre-op evaluation,  At surgeon's request and post-op pain management  Laterality: Right  Prep: chloraprep       Needles:  Injection technique: Single-shot  Needle Type: Echogenic Stimulator Needle     Needle Length: 9cm  Needle Gauge: 21   Needle insertion depth: 7 cm   Additional Needles:   Procedures:,,,, ultrasound used (permanent image in chart),,,,  Narrative:  Start time: 12/31/2020 2:27 PM End time: 12/31/2020 2:32 PM Injection made incrementally with aspirations every 5 mL.  Performed by: Personally  Anesthesiologist: Josephine Igo, MD  Additional Notes: Timeout performed. Patient sedated. Relevant anatomy ID'd using [place]. Incremental 2-58ml injection of LA with frequent aspiration. Patient tolerated procedure well.        Right Adductor Canal Block

## 2020-12-31 NOTE — Discharge Instructions (Addendum)
 Isabella Aluisio, MD Total Joint Specialist [facility] [place] [address] [place], [place] [address] [phone]  TOTAL KNEE REPLACEMENT POSTOPERATIVE DIRECTIONS    Knee Rehabilitation, Guidelines Following Surgery  Results after knee surgery are often greatly improved when you follow the exercise, range of motion and muscle strengthening exercises prescribed by your doctor. Safety measures are also important to protect the knee from further injury. If any of these exercises cause you to have increased pain or swelling in your knee joint, decrease the amount until you are comfortable again and slowly increase them. If you have problems or questions, call your caregiver or physical therapist for advice.   BLOOD CLOT PREVENTION . Take a 10 mg Xarelto once a day for three weeks following surgery. Then take an 81 mg Aspirin once a day for three weeks. Then discontinue Aspirin. . You may resume your vitamins/supplements once you have discontinued the Xarelto. . Do not take any NSAIDs (Advil, Aleve, Ibuprofen, Meloxicam, etc.) until you have discontinued the Xarelto.   HOME CARE INSTRUCTIONS  . Remove items at home which could result in a fall. This includes throw rugs or furniture in walking pathways.  . ICE to the affected knee as much as tolerated. Icing helps control swelling. If the swelling is well controlled you will be more comfortable and rehab easier. Continue to use ice on the knee for pain and swelling from surgery. You may notice swelling that will progress down to the foot and ankle. This is normal after surgery. Elevate the leg when you are not up walking on it.    . Continue to use the breathing machine which will help keep your temperature down. It is common for your temperature to cycle up and down following surgery, especially at night when you are not up moving around and exerting yourself. The breathing machine keeps your lungs expanded and your  temperature down. . Do not place pillow under the operative knee, focus on keeping the knee straight while resting  DIET You may resume your previous home diet once you are discharged from the hospital.  DRESSING / WOUND CARE / SHOWERING . Keep your bulky bandage on for 2 days. On the third post-operative day you may remove the Ace bandage and gauze. There is a waterproof adhesive bandage on your skin which will stay in place until your first follow-up appointment. Once you remove this you will not need to place another bandage . You may begin showering 3 days following surgery, but do not submerge the incision under water.  ACTIVITY For the first 5 days, the key is rest and control of pain and swelling . Do your home exercises twice a day starting on post-operative day 3. On the days you go to physical therapy, just do the home exercises once that day. . You should rest, ice and elevate the leg for 50 minutes out of every hour. Get up and walk/stretch for 10 minutes per hour. After 5 days you can increase your activity slowly as tolerated. . Walk with your walker as instructed. Use the walker until you are comfortable transitioning to a cane. Walk with the cane in the opposite hand of the operative leg. You may discontinue the cane once you are comfortable and walking steadily. . Avoid periods of inactivity such as sitting longer than an hour when not asleep. This helps prevent blood clots.  . You may discontinue the knee immobilizer once you are able to perform a straight leg   raise while lying down. . You may resume a sexual relationship in one month or when given the OK by your doctor.  . You may return to work once you are cleared by your doctor.  . Do not drive a car for 6 weeks or until released by your surgeon.  . Do not drive while taking narcotics.  TED HOSE STOCKINGS Wear the elastic stockings on both legs for three weeks following surgery during the day. You may remove them at night  for sleeping.  WEIGHT BEARING Weight bearing as tolerated with assist device (walker, cane, etc) as directed, use it as long as suggested by your surgeon or therapist, typically at least 4-6 weeks.  POSTOPERATIVE CONSTIPATION PROTOCOL Constipation - defined medically as fewer than three stools per week and severe constipation as less than one stool per week.  One of the most common issues patients have following surgery is constipation.  Even if you have a regular bowel pattern at home, your normal regimen is likely to be disrupted due to multiple reasons following surgery.  Combination of anesthesia, postoperative narcotics, change in appetite and fluid intake all can affect your bowels.  In order to avoid complications following surgery, here are some recommendations in order to help you during your recovery period.  . Colace (docusate) - Pick up an over-the-counter form of Colace or another stool softener and take twice a day as long as you are requiring postoperative pain medications.  Take with a full glass of water daily.  If you experience loose stools or diarrhea, hold the colace until you stool forms back up. If your symptoms do not get better within 1 week or if they get worse, check with your doctor. . Dulcolax (bisacodyl) - Pick up over-the-counter and take as directed by the product packaging as needed to assist with the movement of your bowels.  Take with a full glass of water.  Use this product as needed if not relieved by Colace only.  . MiraLax (polyethylene glycol) - Pick up over-the-counter to have on hand. MiraLax is a solution that will increase the amount of water in your bowels to assist with bowel movements.  Take as directed and can mix with a glass of water, juice, soda, coffee, or tea. Take if you go more than two days without a movement. Do not use MiraLax more than once per day. Call your doctor if you are still constipated or irregular after using this medication for 7 days  in a row.  If you continue to have problems with postoperative constipation, please contact the office for further assistance and recommendations.  If you experience "the worst abdominal pain ever" or develop nausea or vomiting, please contact the office immediatly for further recommendations for treatment.  ITCHING If you experience itching with your medications, try taking only a single pain pill, or even half a pain pill at a time.  You can also use Benadryl over the counter for itching or also to help with sleep.   MEDICATIONS See your medication summary on the "After Visit Summary" that the nursing staff will review with you prior to discharge.  You may have some home medications which will be placed on hold until you complete the course of blood thinner medication.  It is important for you to complete the blood thinner medication as prescribed by your surgeon.  Continue your approved medications as instructed at time of discharge.  PRECAUTIONS . If you experience chest pain or shortness of breath -   call 911 immediately for transfer to the hospital emergency department.  . If you develop a fever greater that 101 F, purulent drainage from wound, increased redness or drainage from wound, foul odor from the wound/dressing, or calf pain - CONTACT YOUR SURGEON.                                                   FOLLOW-UP APPOINTMENTS Make sure you keep all of your appointments after your operation with your surgeon and caregivers. You should call the office at the above phone number and make an appointment for approximately two weeks after the date of your surgery or on the date instructed by your surgeon outlined in the "After Visit Summary".  RANGE OF MOTION AND STRENGTHENING EXERCISES  Rehabilitation of the knee is important following a knee injury or an operation. After just a few days of immobilization, the muscles of the thigh which control the knee become weakened and shrink (atrophy). Knee  exercises are designed to build up the tone and strength of the thigh muscles and to improve knee motion. Often times heat used for twenty to thirty minutes before working out will loosen up your tissues and help with improving the range of motion but do not use heat for the first two weeks following surgery. These exercises can be done on a training (exercise) mat, on the floor, on a table or on a bed. Use what ever works the best and is most comfortable for you Knee exercises include:  . Leg Lifts - While your knee is still immobilized in a splint or cast, you can do straight leg raises. Lift the leg to 60 degrees, hold for 3 sec, and slowly lower the leg. Repeat 10-20 times 2-3 times daily. Perform this exercise against resistance later as your knee gets better.  . Quad and Hamstring Sets - Tighten up the muscle on the front of the thigh (Quad) and hold for 5-10 sec. Repeat this 10-20 times hourly. Hamstring sets are done by pushing the foot backward against an object and holding for 5-10 sec. Repeat as with quad sets.   Leg Slides: Lying on your back, slowly slide your foot toward your buttocks, bending your knee up off the floor (only go as far as is comfortable). Then slowly slide your foot back down until your leg is flat on the floor again.  Angel Wings: Lying on your back spread your legs to the side as far apart as you can without causing discomfort.  A rehabilitation program following serious knee injuries can speed recovery and prevent re-injury in the future due to weakened muscles. Contact your doctor or a physical therapist for more information on knee rehabilitation.   IF YOU ARE TRANSFERRED TO A SKILLED REHAB FACILITY If the patient is transferred to a skilled rehab facility following release from the hospital, a list of the current medications will be sent to the facility for the patient to continue.  When discharged from the skilled rehab facility, please have the facility set up the  patient's Home Health Physical Therapy prior to being released. Also, the skilled facility will be responsible for providing the patient with their medications at time of release from the facility to include their pain medication, the muscle relaxants, and their blood thinner medication. If the patient is still at the rehab facility   at time of the two week follow up appointment, the skilled rehab facility will also need to assist the patient in arranging follow up appointment in our office and any transportation needs.  MAKE SURE YOU:  . Understand these instructions.  . Get help right away if you are not doing well or get worse.   DENTAL ANTIBIOTICS:  In most cases prophylactic antibiotics for Dental procdeures after total joint surgery are not necessary.  Exceptions are as follows:  1. History of prior total joint infection  2. Severely immunocompromised (Organ Transplant, cancer chemotherapy, Rheumatoid biologic meds such as Humera)  3. Poorly controlled diabetes (A1C &gt; 8.0, blood glucose over 200)  If you have one of these conditions, contact your surgeon for an antibiotic prescription, prior to your dental procedure.    Pick up stool softner and laxative for home use following surgery while on pain medications. Do not submerge incision under water. Please use good hand washing techniques while changing dressing each day. May shower starting three days after surgery. Please use a clean towel to pat the incision dry following showers. Continue to use ice for pain and swelling after surgery. Do not use any lotions or creams on the incision until instructed by your surgeon.    Information on my medicine - XARELTO (Rivaroxaban)   Why was Xarelto prescribed for you? Xarelto was prescribed for you to reduce the risk of blood clots forming after orthopedic surgery. The medical term for these abnormal blood clots is venous thromboembolism (VTE).  What do you need to know  about xarelto ? Take your Xarelto ONCE DAILY at the same time every day. You may take it either with or without food.  If you have difficulty swallowing the tablet whole, you may crush it and mix in applesauce just prior to taking your dose.  Take Xarelto exactly as prescribed by your doctor and DO NOT stop taking Xarelto without talking to the doctor who prescribed the medication.  Stopping without other VTE prevention medication to take the place of Xarelto may increase your risk of developing a clot.  After discharge, you should have regular check-up appointments with your healthcare provider that is prescribing your Xarelto.    What do you do if you miss a dose? If you miss a dose, take it as soon as you remember on the same day then continue your regularly scheduled once daily regimen the next day. Do not take two doses of Xarelto on the same day.   Important Safety Information A possible side effect of Xarelto is bleeding. You should call your healthcare provider right away if you experience any of the following: ? Bleeding from an injury or your nose that does not stop. ? Unusual colored urine (red or dark brown) or unusual colored stools (red or black). ? Unusual bruising for unknown reasons. ? A serious fall or if you hit your head (even if there is no bleeding).  Some medicines may interact with Xarelto and might increase your risk of bleeding while on Xarelto. To help avoid this, consult your healthcare provider or pharmacist prior to using any new prescription or non-prescription medications, including herbals, vitamins, non-steroidal anti-inflammatory drugs (NSAIDs) and supplements.  This website has more information on Xarelto: www.xarelto.com.   

## 2020-12-31 NOTE — Anesthesia Postprocedure Evaluation (Signed)
Anesthesia Post Note  Patient: Isabella Moore  Procedure(s) Performed: TOTAL KNEE ARTHROPLASTY (Right Knee)     Patient location during evaluation: PACU Anesthesia Type: Spinal Level of consciousness: oriented and awake and alert Pain management: pain level controlled Vital Signs Assessment: post-procedure vital signs reviewed and stable Respiratory status: spontaneous breathing, respiratory function stable and nonlabored ventilation Cardiovascular status: blood pressure returned to baseline and stable Postop Assessment: no headache, no backache, no apparent nausea or vomiting, patient able to bend at knees and spinal receding Anesthetic complications: no   No complications documented.  Last Vitals:  Vitals:   12/31/20 1745 12/31/20 1804  BP: (!) 143/99 128/74  Pulse: (!) 109 (!) 110  Resp: 13   Temp: 36.8 C (!) 36.4 C  SpO2: 100% 100%    Last Pain:  Vitals:   12/31/20 1804  TempSrc: Oral  PainSc:                  Tamee Battin A.

## 2020-12-31 NOTE — Interval H&P Note (Signed)
History and Physical Interval Note:  12/31/2020 1:05 PM  Isabella Moore  has presented today for surgery, with the diagnosis of right knee osteoarthritis.  The various methods of treatment have been discussed with the patient and family. After consideration of risks, benefits and other options for treatment, the patient has consented to  Procedure(s) with comments: TOTAL KNEE ARTHROPLASTY (Right) - 45min as a surgical intervention.  The patient's history has been reviewed, patient examined, no change in status, stable for surgery.  I have reviewed the patient's chart and labs.  Questions were answered to the patient's satisfaction.     Pilar Plate Noella Kipnis

## 2020-12-31 NOTE — Transfer of Care (Signed)
Immediate Anesthesia Transfer of Care Note  Patient: Isabella Moore  Procedure(s) Performed: TOTAL KNEE ARTHROPLASTY (Right Knee)  Patient Location: PACU  Anesthesia Type:Spinal  Level of Consciousness: awake, alert , oriented and patient cooperative  Airway & Oxygen Therapy: Patient Spontanous Breathing and Patient connected to face mask oxygen  Post-op Assessment: Report given to RN and Post -op Vital signs reviewed and stable  Post vital signs: stable  Last Vitals:  Vitals Value Taken Time  BP 146/91 12/31/20 1652  Temp    Pulse 112 12/31/20 1656  Resp 18 12/31/20 1656  SpO2 100 % 12/31/20 1656  Vitals shown include unvalidated device data.  Last Pain:  Vitals:   12/31/20 1252  TempSrc: Oral  PainSc: 7       Patients Stated Pain Goal: 4 (90/24/09 7353)  Complications: No complications documented.

## 2020-12-31 NOTE — Anesthesia Preprocedure Evaluation (Signed)
Anesthesia Evaluation  Patient identified by MRN, date of birth, ID band Patient awake    Reviewed: Allergy & Precautions, NPO status , Patient's Chart, lab work & pertinent test results  Airway Mallampati: II  TM Distance: >3 FB Neck ROM: Full    Dental no notable dental hx. (+) Teeth Intact, Caps, Dental Advisory Given   Pulmonary pneumonia, resolved,    Pulmonary exam normal breath sounds clear to auscultation       Cardiovascular negative cardio ROS Normal cardiovascular exam Rhythm:Regular Rate:Normal     Neuro/Psych Anxiety Restless legs syndrome negative neurological ROS     GI/Hepatic Neg liver ROS, GERD  Medicated and Controlled,  Endo/Other  Hypothyroidism Obesity  Renal/GU negative Renal ROS  negative genitourinary   Musculoskeletal  (+) Arthritis , Osteoarthritis,  OA right knee   Abdominal (+) + obese,   Peds  Hematology negative hematology ROS (+)   Anesthesia Other Findings   Reproductive/Obstetrics                             Anesthesia Physical Anesthesia Plan  ASA: II  Anesthesia Plan: Spinal   Post-op Pain Management:  [facility] for Post-op pain   Induction:   PONV Risk Score and Plan: 3 and Midazolam, Treatment may vary due to age or medical condition, Ondansetron and Propofol infusion  Airway Management Planned: Natural Airway and Simple Face Mask  Additional Equipment:   Intra-op Plan:   Post-operative Plan:   Informed Consent: I have reviewed the patients History and Physical, chart, labs and discussed the procedure including the risks, benefits and alternatives for the proposed anesthesia with the patient or authorized representative who has indicated his/her understanding and acceptance.     Dental advisory given  Plan Discussed with: CRNA and Anesthesiologist  Anesthesia Plan Comments:         Anesthesia Quick Evaluation

## 2020-12-31 NOTE — Progress Notes (Signed)
Assisted Dr.Michael Royce Macadamia with Right Knee Adductor Canal block. Side rails up, monitors on throughout procedure. See vital signs in flow sheet. Tolerated Procedure well.

## 2020-12-31 NOTE — Progress Notes (Signed)
Orthopedic Tech Progress Note Patient Details:  Isabella Moore 16-Jan-1976 [id number]  Patient ID: Isabella Moore, female   DOB: 03/31/1976, 45 y.o.   MRN: [id number]   Isabella Moore 12/31/2020, 8:49 PM cpm removed @2050 

## 2020-12-31 NOTE — Care Plan (Signed)
Ortho Bundle Case Management Note  Patient Details  Name: Isabella Moore MRN: [id number] Date of Birth: August 29, 1976  R TKA on 12-31-20 DCP:  Home with spouse.  1 story home with 4 ste. DME:  RW ordered through [place].  Has a toilet seat riser. PT:  [facility].  PT eval scheduled on 01-03-21.  DME Arranged:  Gilford Rile rolling DME Agency:  [employer]  HH Arranged:    [place] Agency:  NA  Additional Comments: Please contact me with any questions of if this plan should need to change.  Marianne Sofia, RN,CCM [facility]  [phone] 12/31/2020, 3:50 PM

## 2020-12-31 NOTE — Op Note (Signed)
OPERATIVE REPORT-TOTAL KNEE ARTHROPLASTY   Pre-operative diagnosis- Osteoarthritis  Right knee(s)  Post-operative diagnosis- Osteoarthritis Right knee(s)  Procedure-  Right  Total Knee Arthroplasty  Surgeon- Isabella Plover. Quinnlyn Hearns, MD  Assistant- Molli Barrows, PA-C   Anesthesia-  Adductor canal block and spinal  EBL- 25 ml   Drains None  Tourniquet time- 33 minutes @ 671 mm Hg  Complications- None  Condition-PACU - hemodynamically stable.   Brief Clinical Note  Isabella Moore is a 45 y.o. year old female with end stage OA of her right knee with progressively worsening pain and dysfunction. She has constant pain, with activity and at rest and significant functional deficits with difficulties even with ADLs. She has had extensive non-op management including analgesics, injections of cortisone and viscosupplements, and home exercise program, but remains in significant pain with significant dysfunction.Radiographs show bone on bone arthritis medial and patellofemoral. She presents now for right Total Knee Arthroplasty.    Procedure in detail---   The patient is brought into the operating room and positioned supine on the operating table. After successful administration of  Adductor canal block and spinal,   a tourniquet is placed high on the  Right thigh(s) and the lower extremity is prepped and draped in the usual sterile fashion. Time out is performed by the operating team and then the  Right lower extremity is wrapped in Esmarch, knee flexed and the tourniquet inflated to 300 mmHg.       A midline incision is made with a ten blade through the subcutaneous tissue to the level of the extensor mechanism. A fresh blade is used to make a medial parapatellar arthrotomy. Soft tissue over the proximal medial tibia is subperiosteally elevated to the joint line with a knife and into the semimembranosus bursa with a Cobb elevator. Soft tissue over the proximal lateral tibia is elevated with attention  being paid to avoiding the patellar tendon on the tibial tubercle. The patella is everted, knee flexed 90 degrees and the ACL and PCL are removed. Findings are bone on bone medial and patellofemoral with large global osteophytes        The drill is used to create a starting hole in the distal femur and the canal is thoroughly irrigated with sterile saline to remove the fatty contents. The 5 degree Right  valgus alignment guide is placed into the femoral canal and the distal femoral cutting block is pinned to remove 9 mm off the distal femur. Resection is made with an oscillating saw.      The tibia is subluxed forward and the menisci are removed. The extramedullary alignment guide is placed referencing proximally at the medial aspect of the tibial tubercle and distally along the second metatarsal axis and tibial crest. The block is pinned to remove 57mm off the more deficient medial  side. Resection is made with an oscillating saw. Size 3is the most appropriate size for the tibia and the proximal tibia is prepared with the modular drill and keel punch for that size.      The femoral sizing guide is placed and size 4 is most appropriate. Rotation is marked off the epicondylar axis and confirmed by creating a rectangular flexion gap at 90 degrees. The size 4 cutting block is pinned in this rotation and the anterior, posterior and chamfer cuts are made with the oscillating saw. The intercondylar block is then placed and that cut is made.      Trial size 3 tibial component, trial size 4 posterior stabilized  femur and a 8  mm posterior stabilized rotating platform insert trial is placed. Full extension is achieved with excellent varus/valgus and anterior/posterior balance throughout full range of motion. The patella is everted and thickness measured to be 21  mm. Free hand resection is taken to 12 mm, a 35 template is placed, lug holes are drilled, trial patella is placed, and it tracks normally. Osteophytes are  removed off the posterior femur with the trial in place. All trials are removed and the cut bone surfaces prepared with pulsatile lavage. Cement is mixed and once ready for implantation, the size 3 tibial implant, size  4 posterior stabilized femoral component, and the size 35 patella are cemented in place and the patella is held with the clamp. The trial insert is placed and the knee held in full extension. The Exparel (20 ml mixed with 60 ml saline) is injected into the extensor mechanism, posterior capsule, medial and lateral gutters and subcutaneous tissues.  All extruded cement is removed and once the cement is hard the permanent 8 mm posterior stabilized rotating platform insert is placed into the tibial tray.      The wound is copiously irrigated with saline solution and the extensor mechanism closed with # 0 Stratofix suture. The tourniquet is released for a total tourniquet time of 33  minutes. Flexion against gravity is 140 degrees and the patella tracks normally. Subcutaneous tissue is closed with 2.0 vicryl and subcuticular with running 4.0 Monocryl. The incision is cleaned and dried and steri-strips and a bulky sterile dressing are applied. The limb is placed into a knee immobilizer and the patient is awakened and transported to recovery in stable condition.      Please note that a surgical assistant was a medical necessity for this procedure in order to perform it in a safe and expeditious manner. Surgical assistant was necessary to retract the ligaments and vital neurovascular structures to prevent injury to them and also necessary for proper positioning of the limb to allow for anatomic placement of the prosthesis.   Isabella Plover Devery Murgia, MD    12/31/2020, 4:16 PM

## 2021-01-01 ENCOUNTER — Encounter (HOSPITAL_COMMUNITY): Payer: Self-pay | Admitting: Orthopedic Surgery

## 2021-01-01 DIAGNOSIS — M1711 Unilateral primary osteoarthritis, right knee: Secondary | ICD-10-CM | POA: Diagnosis not present

## 2021-01-01 LAB — BASIC METABOLIC PANEL
Anion gap: 8 (ref 5–15)
BUN: 8 mg/dL (ref 6–20)
CO2: 24 mmol/L (ref 22–32)
Calcium: 8.5 mg/dL — ABNORMAL LOW (ref 8.9–10.3)
Chloride: 104 mmol/L (ref 98–111)
Creatinine, Ser: 0.61 mg/dL (ref 0.44–1.00)
GFR, Estimated: >60 mL/min (ref >60)
Glucose, Bld: 210 mg/dL — ABNORMAL HIGH (ref 70–99)
Potassium: 3.4 mmol/L — ABNORMAL LOW (ref 3.5–5.1)
Sodium: 136 mmol/L (ref 135–145)

## 2021-01-01 LAB — CBC
HCT: 33.4 % — ABNORMAL LOW (ref 36.0–46.0)
Hemoglobin: 10.8 g/dL — ABNORMAL LOW (ref 12.0–15.0)
MCH: 28.3 pg (ref 26.0–34.0)
MCHC: 32.3 g/dL (ref 30.0–36.0)
MCV: 87.7 fL (ref 80.0–100.0)
Platelets: 266 10*3/uL (ref 150–400)
RBC: 3.81 MIL/uL — ABNORMAL LOW (ref 3.87–5.11)
RDW: 13.8 % (ref 11.5–15.5)
WBC: 15.5 10*3/uL — ABNORMAL HIGH (ref 4.0–10.5)
nRBC: 0 % (ref 0.0–0.2)

## 2021-01-01 MED ORDER — RIVAROXABAN 10 MG PO TABS
10.0000 mg | ORAL_TABLET | Freq: Every day | ORAL | 0 refills | Status: DC
Start: 2021-01-01 — End: 2021-01-21

## 2021-01-01 MED ORDER — HYDROMORPHONE HCL 2 MG PO TABS: 2.0000 mg | ORAL_TABLET | Freq: Four times a day (QID) | ORAL | 0 refills | Status: DC | PRN

## 2021-01-01 MED ORDER — POTASSIUM CHLORIDE CRYS ER 20 MEQ PO TBCR
40.0000 meq | EXTENDED_RELEASE_TABLET | ORAL | Status: AC
  Administered 2021-01-01: 40 meq via ORAL
  Filled 2021-01-01: qty 2

## 2021-01-01 MED ORDER — METHOCARBAMOL 500 MG PO TABS: 500.0000 mg | ORAL_TABLET | Freq: Four times a day (QID) | ORAL | 0 refills | Status: DC | PRN

## 2021-01-01 NOTE — Plan of Care (Signed)
Pt ready for DC home 

## 2021-01-01 NOTE — Progress Notes (Signed)
Subjective: 1 Day Post-Op Procedure(s) (LRB): TOTAL KNEE ARTHROPLASTY (Right) Patient reports pain as mild.   Patient seen in rounds by Dr. Wynelle Link. Patient is well, and has had no acute complaints or problems other than pain in the right knee. Denies chest pain, SOB, or calf pain. No issues overnight.  We will continue therapy today.   Objective: Vital signs in last 24 hours: Temp:  [97.7 F (36.5 C)-98.6 F (37 C)] 98.6 F (37 C) (03/29 0456) Pulse Rate:  [90-122] 97 (03/29 0456) Resp:  [8-29] 16 (03/29 0456) BP: (112-161)/(70-107) 126/73 (03/29 0456) SpO2:  [96 %-100 %] 98 % (03/29 0456) Weight:  [78.5 kg] 78.5 kg (03/28 1252)  Intake/Output from previous day:  Intake/Output Summary (Last 24 hours) at 01/01/2021 0738 Last data filed at 01/01/2021 7353 Gross per 24 hour  Intake 3042.91 ml  Output 2750 ml  Net 292.91 ml     Intake/Output this shift: No intake/output data recorded.  Labs: Recent Labs    01/01/21 0325  HGB 10.8*   Recent Labs    01/01/21 0325  WBC 15.5*  RBC 3.81*  HCT 33.4*  PLT 266   Recent Labs    01/01/21 0325  NA 136  K 3.4*  CL 104  CO2 24  BUN 8  CREATININE 0.61  GLUCOSE 210*  CALCIUM 8.5*   No results for input(s): LABPT, INR in the last 72 hours.  Exam: General - Patient is Alert and Oriented Extremity - Neurologically intact Neurovascular intact Sensation intact distally Dorsiflexion/Plantar flexion intact Dressing - dressing C/D/I Motor Function - intact, moving foot and toes well on exam.   Past Medical History:  Diagnosis Date  . Anxiety   . Arthritis   . 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 Atypical nevus 05/15/2011   moderate-lower back  . Atypical nevus 06/23/2013   mild- upper right back  . Atypical nevus 06/23/2013   moderate-lower right back,lower stomach  . Atypical nevus 08/02/2014   mild-left jawline,  right back, left mid back, left lower mid back  . Atypical nevus 03/14/2015   moderate-left inner thigh  . Atypical nevus 12/13/2015   mild-right side superior  . Atypical nevus 07/30/2016   moderate-lower abdomen  . Cancer ([place])    skin  . Hypothyroidism   . Pneumonia     Assessment/Plan: 1 Day Post-Op Procedure(s) (LRB): TOTAL KNEE ARTHROPLASTY (Right) Principal Problem:   OA (osteoarthritis) of knee Active Problems:   Primary osteoarthritis of right knee  Estimated body mass index is 31.64 kg/m as calculated from the following:   Height as of this encounter: 5\' 2"  (1.575 m).   Weight as of this encounter: 78.5 kg. Advance diet Up with therapy D/C IV fluids   Patient's anticipated LOS is less than 2 midnights, meeting these requirements: - Younger than 8 - Lives within 1 hour of care - Has a competent adult at home to recover with post-op recover - NO history of  - Chronic pain requiring opiods  - Diabetes  - Coronary Artery Disease  - Heart failure  - Heart attack  - Stroke  - DVT/VTE  - Cardiac arrhythmia  - Respiratory Failure/COPD  - Renal failure  - Anemia  - Advanced Liver disease  DVT Prophylaxis - Xarelto Weight bearing as tolerated. Continue therapy.  Plan is to go Home after hospital stay. Plan for discharge later today if progresses with  therapy and meeting her goals. Scheduled for OPPT at [facility] in the office in 2 weeks  The PDMP database was reviewed today prior to any opioid medications being prescribed to this patient.  Theresa Duty, PA-C Orthopedic Surgery [phone] 01/01/2021, 7:38 AM

## 2021-01-01 NOTE — Progress Notes (Signed)
Physical Therapy Treatment Patient Details Name: Isabella Moore MRN: [id number] DOB: 08-06-1976 Today's Date: 01/01/2021    History of Present Illness Pt is a 45 year old female s/p right TKA    PT Comments    Pt reports pain improved this afternoon and ambulated in hallway.  Pt also practiced safe stair technique again. Pt provided with HEP and stair handouts and had no further questions.  Pt feels ready for d/c home today.    Follow Up Recommendations  Follow surgeon's recommendation for DC plan and follow-up therapies     Equipment Recommendations  Rolling walker with 5" wheels    Recommendations for Other Services       Precautions / Restrictions Precautions Precautions: Fall;Knee Restrictions Weight Bearing Restrictions: No Other Position/Activity Restrictions: WBAT    Mobility  Bed Mobility Overal bed mobility: Needs Assistance Bed Mobility: Supine to Sit     Supine to sit: Supervision;HOB elevated     General bed mobility comments: pt in bathroom on arrival    Transfers Overall transfer level: Needs assistance Equipment used: Rolling walker (2 wheeled) Transfers: Sit to/from Stand Sit to Stand: Supervision         General transfer comment: verbal cues for UE and LE positioning  Ambulation/Gait Ambulation/Gait assistance: Supervision Gait Distance (Feet): 200 Feet Assistive device: Rolling walker (2 wheeled) Gait Pattern/deviations: Step-to pattern;Antalgic;Decreased stance time - right     General Gait Details: verbal cues for sequence, RW positioning, step length   Stairs Stairs: Yes Stairs assistance: Min guard Stair Management: Step to pattern;Backwards;With walker Number of Stairs: 2 General stair comments: verbal cues for sequence, RW positioning, safety   Wheelchair Mobility    Modified Rankin (Stroke Patients Only)       Balance                                            Cognition Arousal/Alertness:  Awake/alert Behavior During Therapy: WFL for tasks assessed/performed Overall Cognitive Status: Within Functional Limits for tasks assessed                                        Exercises     General Comments        Pertinent Vitals/Pain Pain Assessment: 0-10 Pain Score: 4  Pain Location: right knee Pain Descriptors / Indicators: Sore;Aching Pain Intervention(s): Monitored during session;Repositioned    Home Living Family/patient expects to be discharged to:: Private residence Living Arrangements: Spouse/significant other;Children;Other relatives Available Help at Discharge: Family Type of Home: House Home Access: Stairs to enter Entrance Stairs-Rails: None Home Layout: One level Home Equipment: None      Prior Function Level of Independence: Independent          PT Goals (current goals can now be found in the care plan section) Acute Rehab PT Goals PT Goal Formulation: With patient Time For Goal Achievement: 01/05/21 Potential to Achieve Goals: Good Progress towards PT goals: Progressing toward goals    Frequency    7X/week      PT Plan Current plan remains appropriate    Co-evaluation              AM-PAC PT "6 Clicks" Mobility   Outcome Measure  Help needed turning from your back to your side while in a flat  bed without using bedrails?: None Help needed moving from lying on your back to sitting on the side of a flat bed without using bedrails?: None Help needed moving to and from a bed to a chair (including a wheelchair)?: A Little Help needed standing up from a chair using your arms (e.g., wheelchair or bedside chair)?: A Little Help needed to walk in hospital room?: A Little Help needed climbing 3-5 steps with a railing? : A Little 6 Click Score: 20    End of Session Equipment Utilized During Treatment: Gait belt Activity Tolerance: Patient tolerated treatment well Patient left: in chair;with call bell/phone within  reach;with family/visitor present Nurse Communication: Mobility status PT Visit Diagnosis: Other abnormalities of gait and mobility (R26.89)     Time: 4445-8483 PT Time Calculation (min) (ACUTE ONLY): 16 min  Charges:  $Gait Training: 8-22 mins                    Arlyce Dice, DPT Acute Rehabilitation Services Pager: [phone] Office: [place] 01/01/2021, 3:59 PM

## 2021-01-01 NOTE — TOC Transition Note (Signed)
Transition of Care [facility]) - CM/SW Discharge Note  Patient Details  Name: Isabella Moore MRN: [id number] Date of Birth: 1976/06/10  Transition of Care [facility]) CM/SW Contact:  Sherie Don, LCSW Phone Number: 01/01/2021, 10:00 AM  Clinical Narrative: Patient expected to discharge today. CSW met with patient to confirm discharge plan. Patient will discharge home with OPPT through [facility] with an evaluation date of 01/03/21. Patient in need of rolling walker; [employer] to deliver DME to room. No additional needs at this time. TOC signing off.  Final next level of care: OP Rehab Barriers to Discharge: No Barriers Identified  Patient Goals and CMS Choice Patient states their goals for this hospitalization and ongoing recovery are:: Discharge home with OPPT through [facility] CMS Medicare.gov Compare Post Acute Care list provided to:: Patient Choice offered to / list presented to : Patient  Discharge Plan and Services         DME Arranged: Walker rolling DME Agency: [employer] Representative spoke with at DME Agency: Pre-arranged before surgery [facility] Agency: NA  Readmission Risk Interventions No flowsheet data found.

## 2021-01-01 NOTE — Evaluation (Signed)
Physical Therapy Evaluation Patient Details Name: Isabella Moore MRN: [id number] DOB: 01-Jan-1976 Today's Date: 01/01/2021   History of Present Illness  Pt is a 45 year old female s/p right TKA  Clinical Impression  Pt is s/p TKA resulting in the deficits listed below (see PT Problem List).   Pt will benefit from skilled PT to increase their independence and safety with mobility to allow discharge to the venue listed below.  Pt ambulated in hallway, practiced stair technique and performed LE exercises.  Pt would like to ambulate and practice stairs again prior to d/c.      Follow Up Recommendations Follow surgeon's recommendation for DC plan and follow-up therapies    Equipment Recommendations  Rolling walker with 5" wheels    Recommendations for Other Services       Precautions / Restrictions Precautions Precautions: Fall;Knee Restrictions Weight Bearing Restrictions: No Other Position/Activity Restrictions: WBAT      Mobility  Bed Mobility Overal bed mobility: Needs Assistance Bed Mobility: Supine to Sit     Supine to sit: Supervision;HOB elevated          Transfers Overall transfer level: Needs assistance Equipment used: Rolling walker (2 wheeled) Transfers: Sit to/from Stand Sit to Stand: Min guard         General transfer comment: verbal cues for UE and LE positioning  Ambulation/Gait Ambulation/Gait assistance: Min guard Gait Distance (Feet): 200 Feet Assistive device: Rolling walker (2 wheeled) Gait Pattern/deviations: Step-to pattern;Antalgic;Decreased stance time - right     General Gait Details: verbal cues for sequence, RW positioning, step length  Stairs Stairs: Yes Stairs assistance: Min guard Stair Management: Step to pattern;Backwards;With walker Number of Stairs: 2 General stair comments: verbal cues for sequence, RW positioning, safety  Wheelchair Mobility    Modified Rankin (Stroke Patients Only)       Balance                                              Pertinent Vitals/Pain Pain Assessment: 0-10 Pain Score: 5  Pain Location: right knee Pain Descriptors / Indicators: Sore;Aching Pain Intervention(s): Monitored during session;Repositioned    Home Living Family/patient expects to be discharged to:: Private residence Living Arrangements: Spouse/significant other;Children;Other relatives Available Help at Discharge: Family Type of Home: House Home Access: Stairs to enter Entrance Stairs-Rails: None Entrance Stairs-Number of Steps: 2 Home Layout: One level Home Equipment: None      Prior Function Level of Independence: Independent               Hand Dominance        Extremity/Trunk Assessment        Lower Extremity Assessment Lower Extremity Assessment: RLE deficits/detail RLE Deficits / Details: AAROM approx -14-65* right knee, unable to perform SLR, able to perform ankle pumps       Communication   Communication: No difficulties  Cognition Arousal/Alertness: Awake/alert Behavior During Therapy: WFL for tasks assessed/performed Overall Cognitive Status: Within Functional Limits for tasks assessed                                        General Comments      Exercises Total Joint Exercises Ankle Circles/Pumps: AROM;Both;10 reps Quad Sets: AROM;Right;10 reps Short Arc QuadSinclair Ship;Right;10 reps Heel Slides: AAROM;Right;10 reps;Seated  Hip ABduction/ADduction: AROM;Right;10 reps Straight Leg Raises: AAROM;Right;10 reps   Assessment/Plan    PT Assessment Patient needs continued PT services  PT Problem List Decreased balance;Decreased range of motion;Decreased strength;Decreased mobility;Decreased knowledge of use of DME;Pain       PT Treatment Interventions Stair training;Gait training;Balance training;Therapeutic exercise;DME instruction;Functional mobility training;Therapeutic activities;Patient/family education    PT Goals (Current  goals can be found in the Care Plan section)  Acute Rehab PT Goals PT Goal Formulation: With patient Time For Goal Achievement: 01/05/21 Potential to Achieve Goals: Good    Frequency 7X/week   Barriers to discharge        Co-evaluation               AM-PAC PT "6 Clicks" Mobility  Outcome Measure Help needed turning from your back to your side while in a flat bed without using bedrails?: None Help needed moving from lying on your back to sitting on the side of a flat bed without using bedrails?: None Help needed moving to and from a bed to a chair (including a wheelchair)?: A Little Help needed standing up from a chair using your arms (e.g., wheelchair or bedside chair)?: A Little Help needed to walk in hospital room?: A Little Help needed climbing 3-5 steps with a railing? : A Little 6 Click Score: 20    End of Session Equipment Utilized During Treatment: Gait belt Activity Tolerance: Patient tolerated treatment well Patient left: in chair;with call bell/phone within reach Nurse Communication: Mobility status PT Visit Diagnosis: Other abnormalities of gait and mobility (R26.89)    Time: 0093-8182 PT Time Calculation (min) (ACUTE ONLY): 34 min   Charges:   PT Evaluation $PT Eval Low Complexity: 1 Low PT Treatments $Therapeutic Exercise: 8-22 mins      Jannette Spanner PT, DPT Acute Rehabilitation Services Pager: [phone] Office: [phone]  [place] E 01/01/2021, 12:19 PM

## 2021-01-02 NOTE — Discharge Summary (Signed)
Physician Discharge Summary   Patient ID: Isabella Moore MRN: [id number] DOB/AGE: November 09, 1975 45 y.o.  Admit date: 12/31/2020 Discharge date: 01/01/2021  Primary Diagnosis: Osteoarthritis, right knee   Admission Diagnoses:  Past Medical History:  Diagnosis Date  . Anxiety   . Arthritis   . 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 Atypical nevus 05/15/2011   moderate-lower back  . Atypical nevus 06/23/2013   mild- upper right back  . Atypical nevus 06/23/2013   moderate-lower right back,lower stomach  . Atypical nevus 08/02/2014   mild-left jawline, right back, left mid back, left lower mid back  . Atypical nevus 03/14/2015   moderate-left inner thigh  . Atypical nevus 12/13/2015   mild-right side superior  . Atypical nevus 07/30/2016   moderate-lower abdomen  . Cancer ([place])    skin  . Hypothyroidism   . Pneumonia    Discharge Diagnoses:   Principal Problem:   OA (osteoarthritis) of knee Active Problems:   Primary osteoarthritis of right knee  Estimated body mass index is 31.64 kg/m as calculated from the following:   Height as of this encounter: 5\' 2"  (1.575 m).   Weight as of this encounter: 78.5 kg.  Procedure:  Procedure(s) (LRB): TOTAL KNEE ARTHROPLASTY (Right)   Consults: None  HPI: Isabella Moore is a 45 y.o. year old female with end stage OA of her right knee with progressively worsening pain and dysfunction. She has constant pain, with activity and at rest and significant functional deficits with difficulties even with ADLs. She has had extensive non-op management including analgesics, injections of cortisone and viscosupplements, and home exercise program, but remains in significant pain with significant dysfunction.Radiographs show bone on bone arthritis medial and patellofemoral. She presents now for right Total Knee Arthroplasty.   Laboratory Data: Admission on  12/31/2020, Discharged on 01/01/2021  Component Date Value Ref Range Status  . ABO/RH(D) 12/31/2020    Final                   Value:A POS Performed at [facility], [place] [address] [place], [place] [address]   . WBC 01/01/2021 15.5* 4.0 - 10.5 K/uL Final  . RBC 01/01/2021 3.81* 3.87 - 5.11 MIL/uL Final  . Hemoglobin 01/01/2021 10.8* 12.0 - 15.0 g/dL Final  . HCT 01/01/2021 33.4* 36.0 - 46.0 % Final  . MCV 01/01/2021 87.7  80.0 - 100.0 fL Final  . MCH 01/01/2021 28.3  26.0 - 34.0 pg Final  . MCHC 01/01/2021 32.3  30.0 - 36.0 g/dL Final  . RDW 01/01/2021 13.8  11.5 - 15.5 % Final  . Platelets 01/01/2021 266  150 - 400 K/uL Final  . nRBC 01/01/2021 0.0  0.0 - 0.2 % Final   Performed at [facility], [place] [address], [place], [place] [address]  . Sodium 01/01/2021 136  135 - 145 mmol/L Final  . Potassium 01/01/2021 3.4* 3.5 - 5.1 mmol/L Final  . Chloride 01/01/2021 104  98 - 111 mmol/L Final  . CO2 01/01/2021 24  22 - 32 mmol/L Final  . Glucose, Bld 01/01/2021 210* 70 - 99 mg/dL Final   Glucose reference range applies only to samples taken after fasting for at least 8 hours.  . BUN 01/01/2021 8  6 - 20 mg/dL Final  . Creatinine, Ser 01/01/2021 0.61  0.44 - 1.00 mg/dL Final  . Calcium 01/01/2021 8.5* 8.9 - 10.3 mg/dL  Final  . GFR, Estimated 01/01/2021 >60  >60 mL/min Final   Comment: (NOTE) Calculated using the CKD-EPI Creatinine Equation (2021)   . Anion gap 01/01/2021 8  5 - 15 Final   Performed at [facility], [place] [address], [place], [place] [address]  Hospital Outpatient Visit on 12/28/2020  Component Date Value Ref Range Status  . SARS Coronavirus 2 12/28/2020 NEGATIVE  NEGATIVE Final   Comment: (NOTE) SARS-CoV-2 target nucleic acids are NOT DETECTED.  The SARS-CoV-2 RNA is generally detectable in upper and lower respiratory specimens during the acute phase of infection. Negative results do not preclude SARS-CoV-2  infection, do not rule out co-infections with other pathogens, and should not be used as the sole basis for treatment or other patient management decisions. Negative results must be combined with clinical observations, patient history, and epidemiological information. The expected result is Negative.  Fact Sheet for Patients: SugarRoll.be  Fact Sheet for Healthcare Providers: https://www.woods-mathews.com/  This test is not yet approved or cleared by the [place] FDA and  has been authorized for detection and/or diagnosis of SARS-CoV-2 by FDA under an Emergency Use Authorization (EUA). This EUA will remain  in effect (meaning this test can be used) for the duration of the COVID-19 declaration under Se                          ction 564(b)(1) of the Act, 21 U.S.C. section 360bbb-3(b)(1), unless the authorization is terminated or revoked sooner.  Performed at [facility], [place] [address] [place], [place] [address]   Hospital Outpatient Visit on 12/28/2020  Component Date Value Ref Range Status  . MRSA, PCR 12/28/2020 POSITIVE* NEGATIVE Final   Comment: RESULT CALLED TO, READ BACK BY AND VERIFIED WITH: T.HESTER, RN AT 1450 ON 03.25.22 BY N.THOMPSON   . Staphylococcus aureus 12/28/2020 POSITIVE* NEGATIVE Final   Comment: (NOTE) The Xpert SA Assay (FDA approved for NASAL specimens in patients 3 years of age and older), is one component of a comprehensive surveillance program. It is not intended to diagnose infection nor to guide or monitor treatment. Performed at [facility], [place] [address], [place], [place] [address]   . WBC 12/28/2020 9.4  4.0 - 10.5 K/uL Final  . RBC 12/28/2020 4.15  3.87 - 5.11 MIL/uL Final  . Hemoglobin 12/28/2020 11.6* 12.0 - 15.0 g/dL Final  . HCT 12/28/2020 36.8  36.0 - 46.0 % Final  . MCV 12/28/2020 88.7  80.0 - 100.0 fL Final  . MCH 12/28/2020 28.0  26.0 - 34.0 pg Final  . MCHC  12/28/2020 31.5  30.0 - 36.0 g/dL Final  . RDW 12/28/2020 13.8  11.5 - 15.5 % Final  . Platelets 12/28/2020 284  150 - 400 K/uL Final  . nRBC 12/28/2020 0.0  0.0 - 0.2 % Final   Performed at [facility], [place] [address], [place], [place] [address]  . Sodium 12/28/2020 138  135 - 145 mmol/L Final  . Potassium 12/28/2020 3.3* 3.5 - 5.1 mmol/L Final  . Chloride 12/28/2020 99  98 - 111 mmol/L Final  . CO2 12/28/2020 30  22 - 32 mmol/L Final  . Glucose, Bld 12/28/2020 97  70 - 99 mg/dL Final   Glucose reference range applies only to samples taken after fasting for at least 8 hours.  . BUN 12/28/2020 9  6 - 20 mg/dL Final  . Creatinine, Ser 12/28/2020 0.89  0.44 -  1.00 mg/dL Final  . Calcium 12/28/2020 8.7* 8.9 - 10.3 mg/dL Final  . Total Protein 12/28/2020 7.2  6.5 - 8.1 g/dL Final  . Albumin 12/28/2020 3.9  3.5 - 5.0 g/dL Final  . AST 12/28/2020 18  15 - 41 U/L Final  . ALT 12/28/2020 17  0 - 44 U/L Final  . Alkaline Phosphatase 12/28/2020 63  38 - 126 U/L Final  . Total Bilirubin 12/28/2020 0.5  0.3 - 1.2 mg/dL Final  . GFR, Estimated 12/28/2020 >60  >60 mL/min Final   Comment: (NOTE) Calculated using the CKD-EPI Creatinine Equation (2021)   . Anion gap 12/28/2020 9  5 - 15 Final   Performed at [facility], [place] [address], [place], [place] [address]  . Prothrombin Time 12/28/2020 12.5  11.4 - 15.2 seconds Final  . INR 12/28/2020 1.0  0.8 - 1.2 Final   Comment: (NOTE) INR goal varies based on device and disease states. Performed at [facility], [place] [address], [place], [place] [address]   . aPTT 12/28/2020 31  24 - 36 seconds Final   Performed at [facility], [place] [address] [place], [place] [address]  . ABO/RH(D) 12/28/2020 A POS   Final  . Antibody Screen 12/28/2020 NEG   Final  . Sample Expiration 12/28/2020 01/03/2021,2359   Final  . Extend sample reason 12/28/2020    Final                   Value:NO  TRANSFUSIONS OR PREGNANCY IN THE PAST 3 MONTHS Performed at [facility], [place] [address] [place], [place] [address]      X-Rays:No results found.  EKG:No orders found for this or any previous visit.   Hospital Course: Angalena Cousineau is a 45 y.o. who was admitted to [facility]. They were brought to the operating room on 12/31/2020 and underwent Procedure(s): TOTAL KNEE ARTHROPLASTY.  Patient tolerated the procedure well and was later transferred to the recovery room and then to the orthopaedic floor for postoperative care. They were given PO and IV analgesics for pain control following their surgery. They were given 24 hours of postoperative antibiotics of  Anti-infectives (From admission, onward)   Start     Dose/Rate Route Frequency Ordered Stop   12/31/20 2100  ceFAZolin (ANCEF) IVPB 2g/100 mL premix        2 g 200 mL/hr over 30 Minutes Intravenous Every 6 hours 12/31/20 1808 01/01/21 0256   12/31/20 1245  ceFAZolin (ANCEF) IVPB 2g/100 mL premix        2 g 200 mL/hr over 30 Minutes Intravenous On call to O.R. 12/31/20 1238 12/31/20 1505   12/31/20 1245  ceFAZolin (ANCEF) IVPB 2g/100 mL premix  Status:  Discontinued        2 g 200 mL/hr over 30 Minutes Intravenous  Once 12/31/20 1238 12/31/20 1241   12/31/20 0600  vancomycin (VANCOCIN) IVPB 1000 mg/200 mL premix        1,000 mg 200 mL/hr over 60 Minutes Intravenous On call to O.R. 12/30/20 1148 12/31/20 1537     and started on DVT prophylaxis in the form of Xarelto.   PT and OT were ordered for total joint protocol. Discharge planning consulted to help with postop disposition and equipment needs.  Patient had a good night on the evening of surgery. They started to get up OOB with therapy on POD #0. Pt was seen during rounds and was ready to  go home pending progress with therapy. She worked with therapy on POD #1 and was meeting her goals. Pt was discharged to home later that day in stable condition.  Diet:  Regular diet Activity: WBAT Follow-up: in 2 weeks Disposition: Home with OPPT Discharged Condition: stable   Discharge Instructions    Call MD / Call 911   Complete by: As directed    If you experience chest pain or shortness of breath, CALL 911 and be transported to the hospital emergency room.  If you develope a fever above 101 F, pus (white drainage) or increased drainage or redness at the wound, or calf pain, call your surgeon's office.   Change dressing   Complete by: As directed    You may remove the bulky bandage (ACE wrap and gauze) two days after surgery. You will have an adhesive waterproof bandage underneath. Leave this in place until your first follow-up appointment.   Constipation Prevention   Complete by: As directed    Drink plenty of fluids.  Prune juice may be helpful.  You may use a stool softener, such as Colace (over the counter) 100 mg twice a day.  Use MiraLax (over the counter) for constipation as needed.   Diet - low sodium heart healthy   Complete by: As directed    Do not put a pillow under the knee. Place it under the heel.   Complete by: As directed    Driving restrictions   Complete by: As directed    No driving for two weeks   TED hose   Complete by: As directed    Use stockings (TED hose) for three weeks on both leg(s).  You may remove them at night for sleeping.   Weight bearing as tolerated   Complete by: As directed      Allergies as of 01/01/2021      Reactions   Codeine Hives, Nausea And Vomiting, Rash, Other (See Comments)   hallucinations   Latex Hives, Rash   Morphine Hives, Rash, Other (See Comments)   hallucinations   Pork Allergy Anaphylaxis   Ondansetron Other (See Comments)   Severe headaches    Wound Dressing Adhesive Rash      Medication List    STOP taking these medications   B-12 PO   ibuprofen 800 MG tablet Commonly known as: ADVIL     TAKE these medications   B-6 PO Place 1 drop under the tongue in the morning and  at bedtime.   BARIATRIC FUSION PO Take 1 tablet by mouth in the morning and at bedtime.   CALCI-CHEW PO Take 1 tablet by mouth in the morning and at bedtime.   diphenhydrAMINE 25 mg capsule Commonly known as: BENADRYL Take 25 mg by mouth every 6 (six) hours as needed for allergies.   DULoxetine 60 MG capsule Commonly known as: CYMBALTA Take 60 mg by mouth every morning.   furosemide 80 MG tablet Commonly known as: LASIX Take 80 mg by mouth 2 (two) times daily.   HYDROmorphone 2 MG tablet Commonly known as: DILAUDID Take 1-2 tablets (2-4 mg total) by mouth every 6 (six) hours as needed for severe pain.   Linzess 290 MCG Caps capsule Generic drug: linaclotide Take 290 mcg by mouth in the morning.   methocarbamol 500 MG tablet Commonly known as: ROBAXIN Take 1 tablet (500 mg total) by mouth every 6 (six) hours as needed for muscle spasms.   PARoxetine 40 MG tablet Commonly known as: PAXIL Take  10 mg by mouth at bedtime. 0.25 tablet   pregabalin 50 MG capsule Commonly known as: LYRICA Take 50 mg by mouth 2 (two) times daily.   promethazine 25 MG tablet Commonly known as: PHENERGAN Take 25 mg by mouth every 6 (six) hours as needed for nausea/vomiting.   rivaroxaban 10 MG Tabs tablet Commonly known as: XARELTO Take 1 tablet (10 mg total) by mouth daily with breakfast for 20 days.   ropinirole 5 MG tablet Commonly known as: REQUIP Take 5 mg by mouth in the morning, at noon, and at bedtime. What changed: Another medication with the same name was removed. Continue taking this medication, and follow the directions you see here.   spironolactone 50 MG tablet Commonly known as: ALDACTONE Take 50 mg by mouth in the morning.   tiZANidine 4 MG tablet Commonly known as: ZANAFLEX Take 4 mg by mouth in the morning, at noon, and at bedtime.   traMADol 50 MG tablet Commonly known as: ULTRAM Take 50 mg by mouth every 6 (six) hours as needed (pain).             Discharge Care Instructions  (From admission, onward)         Start     Ordered   01/01/21 0000  Weight bearing as tolerated        01/01/21 0741   01/01/21 0000  Change dressing       Comments: You may remove the bulky bandage (ACE wrap and gauze) two days after surgery. You will have an adhesive waterproof bandage underneath. Leave this in place until your first follow-up appointment.   01/01/21 0741          Follow-up Information    Gaynelle Arabian, MD. Go on 01/15/2021.   Specialty: Orthopedic Surgery Why: You are scheduled for a post-operative appointment on 01-15-21 at 4:30 pm.  Contact information: [address] [place] [address] [phone]               Signed: Theresa Duty, PA-C Orthopedic Surgery 01/02/2021, 9:31 AM

## 2021-01-03 ENCOUNTER — Telehealth: Payer: Self-pay | Admitting: Cardiology

## 2021-01-03 ENCOUNTER — Encounter (HOSPITAL_COMMUNITY): Payer: Medicare HMO

## 2021-01-03 ENCOUNTER — Other Ambulatory Visit (HOSPITAL_COMMUNITY): Payer: Medicare (Managed Care)

## 2021-01-03 ENCOUNTER — Encounter (HOSPITAL_COMMUNITY): Payer: Medicare (Managed Care)

## 2021-01-03 ENCOUNTER — Ambulatory Visit (HOSPITAL_COMMUNITY)
Admission: RE | Admit: 2021-01-03 | Discharge: 2021-01-03 | Disposition: A | Discharge status: Home / Self Care | Payer: Medicare HMO | Source: Ambulatory Visit | Attending: Cardiology | Admitting: Cardiology

## 2021-01-03 ENCOUNTER — Other Ambulatory Visit (HOSPITAL_COMMUNITY): Payer: Self-pay | Admitting: Internal Medicine

## 2021-01-03 ENCOUNTER — Other Ambulatory Visit: Payer: Self-pay

## 2021-01-03 DIAGNOSIS — M7989 Other specified soft tissue disorders: Secondary | ICD-10-CM | POA: Insufficient documentation

## 2021-01-03 DIAGNOSIS — M79661 Pain in right lower leg: Secondary | ICD-10-CM

## 2021-01-03 NOTE — Telephone Encounter (Signed)
RN OBTAINED REPORT FOR PV TECH. UNABLE TO SPEAK  MAGGIE.  FAXED COMPLETED  REPORT TO GIVEN FAX NUMBER

## 2021-01-03 NOTE — Telephone Encounter (Signed)
They needed to know patient doppler to know if she has to blood clout or not, so they can know rather to prescribe meds

## 2021-01-03 NOTE — Telephone Encounter (Signed)
Isabella Moore is requesting verbal and fax results from doppler reading Fax# [phone]

## 2021-01-20 ENCOUNTER — Other Ambulatory Visit: Payer: Self-pay

## 2021-01-20 ENCOUNTER — Encounter (HOSPITAL_COMMUNITY): Payer: Self-pay | Admitting: Emergency Medicine

## 2021-01-20 ENCOUNTER — Inpatient Hospital Stay (HOSPITAL_COMMUNITY)
Admission: EM | Admit: 2021-01-20 | Discharge: 2021-01-26 | DRG: 559 | Disposition: A | Discharge status: Home / Self Care | Payer: Medicare Other | Attending: Internal Medicine | Admitting: Internal Medicine

## 2021-01-20 DIAGNOSIS — G254 Drug-induced chorea: Secondary | ICD-10-CM | POA: Diagnosis present

## 2021-01-20 DIAGNOSIS — T8453XA Infection and inflammatory reaction due to internal right knee prosthesis, initial encounter: Principal | ICD-10-CM | POA: Diagnosis present

## 2021-01-20 DIAGNOSIS — E876 Hypokalemia: Secondary | ICD-10-CM | POA: Diagnosis not present

## 2021-01-20 DIAGNOSIS — X100XXA Contact with hot drinks, initial encounter: Secondary | ICD-10-CM | POA: Diagnosis present

## 2021-01-20 DIAGNOSIS — N179 Acute kidney failure, unspecified: Secondary | ICD-10-CM

## 2021-01-20 DIAGNOSIS — R339 Retention of urine, unspecified: Secondary | ICD-10-CM | POA: Diagnosis present

## 2021-01-20 DIAGNOSIS — G259 Extrapyramidal and movement disorder, unspecified: Secondary | ICD-10-CM

## 2021-01-20 DIAGNOSIS — G4733 Obstructive sleep apnea (adult) (pediatric): Secondary | ICD-10-CM | POA: Diagnosis present

## 2021-01-20 DIAGNOSIS — T8149XA Infection following a procedure, other surgical site, initial encounter: Secondary | ICD-10-CM

## 2021-01-20 DIAGNOSIS — Z888 Allergy status to other drugs, medicaments and biological substances status: Secondary | ICD-10-CM

## 2021-01-20 DIAGNOSIS — G4700 Insomnia, unspecified: Secondary | ICD-10-CM | POA: Diagnosis present

## 2021-01-20 DIAGNOSIS — F32A Depression, unspecified: Secondary | ICD-10-CM | POA: Diagnosis present

## 2021-01-20 DIAGNOSIS — J189 Pneumonia, unspecified organism: Secondary | ICD-10-CM | POA: Diagnosis not present

## 2021-01-20 DIAGNOSIS — G9341 Metabolic encephalopathy: Secondary | ICD-10-CM | POA: Diagnosis not present

## 2021-01-20 DIAGNOSIS — Z91018 Allergy to other foods: Secondary | ICD-10-CM

## 2021-01-20 DIAGNOSIS — Z20822 Contact with and (suspected) exposure to covid-19: Secondary | ICD-10-CM | POA: Diagnosis present

## 2021-01-20 DIAGNOSIS — K121 Other forms of stomatitis: Secondary | ICD-10-CM | POA: Diagnosis present

## 2021-01-20 DIAGNOSIS — G2581 Restless legs syndrome: Secondary | ICD-10-CM | POA: Diagnosis present

## 2021-01-20 DIAGNOSIS — K219 Gastro-esophageal reflux disease without esophagitis: Secondary | ICD-10-CM | POA: Diagnosis present

## 2021-01-20 DIAGNOSIS — E8770 Fluid overload, unspecified: Secondary | ICD-10-CM | POA: Diagnosis not present

## 2021-01-20 DIAGNOSIS — Z91040 Latex allergy status: Secondary | ICD-10-CM

## 2021-01-20 DIAGNOSIS — M6282 Rhabdomyolysis: Secondary | ICD-10-CM | POA: Diagnosis present

## 2021-01-20 DIAGNOSIS — Y831 Surgical operation with implant of artificial internal device as the cause of abnormal reaction of the patient, or of later complication, without mention of misadventure at the time of the procedure: Secondary | ICD-10-CM | POA: Diagnosis present

## 2021-01-20 DIAGNOSIS — F419 Anxiety disorder, unspecified: Secondary | ICD-10-CM | POA: Diagnosis present

## 2021-01-20 DIAGNOSIS — R748 Abnormal levels of other serum enzymes: Secondary | ICD-10-CM

## 2021-01-20 DIAGNOSIS — G249 Dystonia, unspecified: Secondary | ICD-10-CM | POA: Diagnosis present

## 2021-01-20 DIAGNOSIS — Z885 Allergy status to narcotic agent status: Secondary | ICD-10-CM

## 2021-01-20 DIAGNOSIS — T428X5A Adverse effect of antiparkinsonism drugs and other central muscle-tone depressants, initial encounter: Secondary | ICD-10-CM | POA: Diagnosis present

## 2021-01-20 DIAGNOSIS — Z79899 Other long term (current) drug therapy: Secondary | ICD-10-CM

## 2021-01-20 DIAGNOSIS — K912 Postsurgical malabsorption, not elsewhere classified: Secondary | ICD-10-CM | POA: Diagnosis present

## 2021-01-20 LAB — CBC WITH DIFFERENTIAL/PLATELET
Abs Immature Granulocytes: 0.03 10*3/uL (ref 0.00–0.07)
Basophils Absolute: 0.1 10*3/uL (ref 0.0–0.1)
Basophils Relative: 1 %
Eosinophils Absolute: 0.7 10*3/uL — ABNORMAL HIGH (ref 0.0–0.5)
Eosinophils Relative: 6 %
HCT: 30.4 % — ABNORMAL LOW (ref 36.0–46.0)
Hemoglobin: 9.9 g/dL — ABNORMAL LOW (ref 12.0–15.0)
Immature Granulocytes: 0 %
Lymphocytes Relative: 17 %
Lymphs Abs: 1.9 10*3/uL (ref 0.7–4.0)
MCH: 28.1 pg (ref 26.0–34.0)
MCHC: 32.6 g/dL (ref 30.0–36.0)
MCV: 86.4 fL (ref 80.0–100.0)
Monocytes Absolute: 1.1 10*3/uL — ABNORMAL HIGH (ref 0.1–1.0)
Monocytes Relative: 9 %
Neutro Abs: 7.6 10*3/uL (ref 1.7–7.7)
Neutrophils Relative %: 67 %
Platelets: 641 10*3/uL — ABNORMAL HIGH (ref 150–400)
RBC: 3.52 MIL/uL — ABNORMAL LOW (ref 3.87–5.11)
RDW: 14.8 % (ref 11.5–15.5)
WBC: 11.4 10*3/uL — ABNORMAL HIGH (ref 4.0–10.5)
nRBC: 0 % (ref 0.0–0.2)

## 2021-01-20 LAB — URINALYSIS, ROUTINE W REFLEX MICROSCOPIC
Bilirubin Urine: NEGATIVE
Glucose, UA: NEGATIVE mg/dL
Ketones, ur: NEGATIVE mg/dL
Leukocytes,Ua: NEGATIVE
Nitrite: NEGATIVE
Protein, ur: NEGATIVE mg/dL
Specific Gravity, Urine: 1.006 (ref 1.005–1.030)
pH: 6 (ref 5.0–8.0)

## 2021-01-20 LAB — C-REACTIVE PROTEIN: CRP: 15.1 mg/dL — ABNORMAL HIGH (ref <1.0)

## 2021-01-20 LAB — PREGNANCY, URINE: Preg Test, Ur: NEGATIVE

## 2021-01-20 LAB — BASIC METABOLIC PANEL
Anion gap: 16 — ABNORMAL HIGH (ref 5–15)
BUN: 16 mg/dL (ref 6–20)
CO2: 29 mmol/L (ref 22–32)
Calcium: 8.6 mg/dL — ABNORMAL LOW (ref 8.9–10.3)
Chloride: 92 mmol/L — ABNORMAL LOW (ref 98–111)
Creatinine, Ser: 1.18 mg/dL — ABNORMAL HIGH (ref 0.44–1.00)
GFR, Estimated: 58 mL/min — ABNORMAL LOW (ref >60)
Glucose, Bld: 107 mg/dL — ABNORMAL HIGH (ref 70–99)
Potassium: 2.1 mmol/L — CL (ref 3.5–5.1)
Sodium: 137 mmol/L (ref 135–145)

## 2021-01-20 MED ORDER — POTASSIUM CHLORIDE CRYS ER 20 MEQ PO TBCR
40.0000 meq | EXTENDED_RELEASE_TABLET | Freq: Once | ORAL | Status: AC
  Administered 2021-01-20: 40 meq via ORAL
  Filled 2021-01-20: qty 2

## 2021-01-20 MED ORDER — DIAZEPAM 5 MG/ML IJ SOLN
5.0000 mg | Freq: Once | INTRAMUSCULAR | Status: AC
  Administered 2021-01-21: 5 mg via INTRAVENOUS
  Filled 2021-01-20: qty 2

## 2021-01-20 MED ORDER — POTASSIUM CHLORIDE 10 MEQ/100ML IV SOLN
10.0000 meq | INTRAVENOUS | Status: AC
  Administered 2021-01-20 – 2021-01-21 (×6): 10 meq via INTRAVENOUS
  Filled 2021-01-20 (×6): qty 100

## 2021-01-20 NOTE — ED Triage Notes (Signed)
Patient presents s/p knee replacement 1 month ago. Patient states incision is infected. Incision noted to be red and swollen. Patient unable to sit still, bouncing around in wheelchair stating it is due to pain. Patient also endorses urinary retention, last urinated this morning around 0430. Patient denies dysuria. Patient noted to have odd behaviors during assessment.

## 2021-01-20 NOTE — ED Provider Notes (Signed)
[facility] DEPT Provider Note: [name], MD, FACEP  CSN: [id number] MRN: [id number] ARRIVAL: 01/20/21 at 2142 ROOM: [place]  Post-op Problem   HISTORY OF PRESENT ILLNESS  01/20/21 11:45 PM Isabella Moore is a 45 y.o. female who had a right total knee arthroplasty on 12/31/2020 by Dr. Gaynelle Arabian.  6 days ago she developed erythema, increasing pain and fevers to just under 101.  Her orthopedist started her on Keflex 5 days ago.  Despite this she continues to have erythema and pain in her right knee with fever.  She rates her knee pain is a 9 out of 10, worse with attempted movement.  Since yesterday she has had semivoluntary movements of all of her muscles.  She is lying in her bed twitching and jerking.  She states she is able to control it temporarily but feels the need to constantly move.  She is on ropinirole for restless leg syndrome but states that this is not the same.  She states all of her muscles feel sore.  She states she has frequent insomnia but this is not new for her.  She was also having difficulty voiding her bladder earlier.  Soon after surgery she developed ulcerations of her lower lip which she was told was "an allergic reaction" but she does not know what she might have been allergic to.  She denies any ocular or genital mucosal involvement.  Prior to my evaluation of the patient she was found to have a potassium of 2.1 and orders were placed for oral and IV repletion.   Past Medical History:  Diagnosis Date  . Anxiety   . Arthritis   . 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 Atypical nevus 05/15/2011   moderate-lower back  . Atypical nevus 06/23/2013   mild- upper right back  . Atypical nevus 06/23/2013   moderate-lower right back,lower stomach  . Atypical nevus 08/02/2014   mild-left jawline, right back, left mid back, left lower mid back  .  Atypical nevus 03/14/2015   moderate-left inner thigh  . Atypical nevus 12/13/2015   mild-right side superior  . Atypical nevus 07/30/2016   moderate-lower abdomen  . Cancer ([place])    skin  . Hypothyroidism   . Pneumonia     Past Surgical History:  Procedure Laterality Date  . ABDOMINAL HYSTERECTOMY    . CESAREAN SECTION     x two  . CHOLECYSTECTOMY    . GASTRIC BYPASS    . HERNIA REPAIR     ventral  . I & D EXTREMITY     abdomen  . NECK SURGERY     C 4-5  . SLEEVE GASTROPLASTY    . TENDON REPAIR Left   . TOTAL KNEE ARTHROPLASTY Right 12/31/2020   Procedure: TOTAL KNEE ARTHROPLASTY;  Surgeon: Gaynelle Arabian, MD;  Location: [facility];  Service: Orthopedics;  Laterality: Right;  67min    No family history on file.  Social History   Tobacco Use  . Smoking status: Never Smoker  . Smokeless tobacco: Never Used  Vaping Use  . Vaping Use: Never used  Substance Use Topics  . Alcohol use: Never  . Drug use: Never    Prior to Admission medications   Medication Sig Start Date End Date Taking? Authorizing Provider  Calcium Carbonate (CALCI-CHEW PO) Take 1 tablet by mouth in the morning and at bedtime.  [provider]  diphenhydrAMINE (BENADRYL) 25 mg capsule Take 25 mg by mouth every 6 (six) hours as needed for allergies.    [provider]  DULoxetine (CYMBALTA) 60 MG capsule Take 60 mg by mouth every morning. 11/27/20   [provider]  furosemide (LASIX) 80 MG tablet Take 80 mg by mouth 2 (two) times daily. 12/07/20   [provider]  HYDROmorphone (DILAUDID) 2 MG tablet Take 1-2 tablets (2-4 mg total) by mouth every 6 (six) hours as needed for severe pain. 01/01/21   Edmisten, Kristie L, PA  LINZESS 290 MCG CAPS capsule Take 290 mcg by mouth in the morning. 11/04/20   [provider]  methocarbamol (ROBAXIN) 500 MG tablet Take 1 tablet (500 mg total) by mouth every 6 (six) hours as needed for muscle spasms. 01/01/21   Fenton Foy  D, PA-C  Multiple Vitamins-Minerals (BARIATRIC FUSION PO) Take 1 tablet by mouth in the morning and at bedtime.    [provider]  PARoxetine (PAXIL) 40 MG tablet Take 10 mg by mouth at bedtime. 0.25 tablet    [provider]  pregabalin (LYRICA) 50 MG capsule Take 50 mg by mouth 2 (two) times daily. 11/24/20   [provider]  promethazine (PHENERGAN) 25 MG tablet Take 25 mg by mouth every 6 (six) hours as needed for nausea/vomiting. 08/03/20   [provider]  Pyridoxine HCl (B-6 PO) Place 1 drop under the tongue in the morning and at bedtime.    [provider]  rivaroxaban (XARELTO) 10 MG TABS tablet Take 1 tablet (10 mg total) by mouth daily with breakfast for 20 days. 01/01/21 01/21/21  Edmisten, Ok Anis, PA  ropinirole (REQUIP) 5 MG tablet Take 5 mg by mouth in the morning, at noon, and at bedtime. 12/08/20   [provider]  spironolactone (ALDACTONE) 50 MG tablet Take 50 mg by mouth in the morning. 12/01/20   [provider]  tiZANidine (ZANAFLEX) 4 MG tablet Take 4 mg by mouth in the morning, at noon, and at bedtime. 11/21/20   [provider]  traMADol (ULTRAM) 50 MG tablet Take 50 mg by mouth every 6 (six) hours as needed (pain).    [provider]    Allergies Codeine, Latex, Morphine, Pork allergy, Ondansetron, Oxycodone, and Wound dressing adhesive   REVIEW OF SYSTEMS  Negative except as noted here or in the History of Present Illness.   PHYSICAL EXAMINATION  Initial Vital Signs Blood pressure 132/76, pulse (!) 109, temperature 98 F (36.7 C), temperature source Oral, resp. rate 16, height 5\' 2"  (1.575 m), weight 77.1 kg, SpO2 97 %.  Examination General: Well-developed, well-nourished female in no acute distress; appearance consistent with age of record HENT: normocephalic; atraumatic; shallow ulcerations of lower lip:    Eyes: pupils equal, round and reactive to light; extraocular muscles  intact Neck: supple Heart: regular rate and rhythm; tachycardia Lungs: clear to auscultation bilaterally Abdomen: soft; nondistended; nontender; bowel sounds present Extremities: No deformity; pulses normal; erythema and tenderness of right knee around surgical site:    Neurologic: Awake, alert and oriented; motor function intact in all extremities; continuous movement of all limbs and facial muscles with only partial voluntary ability to suppress this  Skin: Warm and dry Psychiatric: Normal mood and affect   RESULTS  Summary of this visit's results, reviewed and interpreted by myself:   EKG Interpretation  Date/Time:  Sunday January 20 2021 23:32:57 EDT Ventricular Rate:  116 PR Interval:  142 QRS Duration: 96 QT Interval:  345 QTC Calculation: 480 R Axis:   28 Text Interpretation: Sinus tachycardia Probable left atrial enlargement Borderline ST depression, lateral leads No previous ECGs available Confirmed by Verlia Kaney, Jenny Reichmann [phone]) on 01/20/2021 11:44:54 PM      Laboratory Studies: Results for orders placed or performed during the hospital encounter of 01/20/21 (from the past 24 hour(s))  CBC with Differential     Status: Abnormal   Collection Time: 01/20/21  9:59 PM  Result Value Ref Range   WBC 11.4 (H) 4.0 - 10.5 K/uL   RBC 3.52 (L) 3.87 - 5.11 MIL/uL   Hemoglobin 9.9 (L) 12.0 - 15.0 g/dL   HCT 30.4 (L) 36.0 - 46.0 %   MCV 86.4 80.0 - 100.0 fL   MCH 28.1 26.0 - 34.0 pg   MCHC 32.6 30.0 - 36.0 g/dL   RDW 14.8 11.5 - 15.5 %   Platelets 641 (H) 150 - 400 K/uL   nRBC 0.0 0.0 - 0.2 %   Neutrophils Relative % 67 %   Neutro Abs 7.6 1.7 - 7.7 K/uL   Lymphocytes Relative 17 %   Lymphs Abs 1.9 0.7 - 4.0 K/uL   Monocytes Relative 9 %   Monocytes Absolute 1.1 (H) 0.1 - 1.0 K/uL   Eosinophils Relative 6 %   Eosinophils Absolute 0.7 (H) 0.0 - 0.5 K/uL   Basophils Relative 1 %   Basophils Absolute 0.1 0.0 - 0.1 K/uL   Immature Granulocytes 0 %   Abs Immature Granulocytes 0.03  0.00 - 0.07 K/uL  Basic metabolic panel     Status: Abnormal   Collection Time: 01/20/21  9:59 PM  Result Value Ref Range   Sodium 137 135 - 145 mmol/L   Potassium 2.1 (LL) 3.5 - 5.1 mmol/L   Chloride 92 (L) 98 - 111 mmol/L   CO2 29 22 - 32 mmol/L   Glucose, Bld 107 (H) 70 - 99 mg/dL   BUN 16 6 - 20 mg/dL   Creatinine, Ser 1.18 (H) 0.44 - 1.00 mg/dL   Calcium 8.6 (L) 8.9 - 10.3 mg/dL   GFR, Estimated 58 (L) >60 mL/min   Anion gap 16 (H) 5 - 15  Urinalysis, Routine w reflex microscopic Urine, Clean Catch     Status: Abnormal   Collection Time: 01/20/21  9:59 PM  Result Value Ref Range   Color, Urine YELLOW YELLOW   APPearance HAZY (A) CLEAR   Specific Gravity, Urine 1.006 1.005 - 1.030   pH 6.0 5.0 - 8.0   Glucose, UA NEGATIVE NEGATIVE mg/dL   Hgb urine dipstick MODERATE (A) NEGATIVE   Bilirubin Urine NEGATIVE NEGATIVE   Ketones, ur NEGATIVE NEGATIVE mg/dL   Protein, ur NEGATIVE NEGATIVE mg/dL   Nitrite NEGATIVE NEGATIVE   Leukocytes,Ua NEGATIVE NEGATIVE   RBC / HPF 0-5 0 - 5 RBC/hpf   WBC, UA 0-5 0 - 5 WBC/hpf   Bacteria, UA RARE (A) NONE SEEN   Squamous Epithelial / LPF 11-20 0 - 5   Hyaline Casts, UA PRESENT   Pregnancy, urine     Status: None   Collection Time: 01/20/21  9:59 PM  Result Value Ref Range   Preg Test, Ur NEGATIVE NEGATIVE  CK     Status: Abnormal   Collection Time: 01/20/21  9:59 PM  Result Value Ref Range   Total CK 3,181 (H) 38 - 234 U/L  Magnesium     Status: None   Collection Time: 01/20/21  9:59  PM  Result Value Ref Range   Magnesium 2.4 1.7 - 2.4 mg/dL  Hepatic function panel     Status: Abnormal   Collection Time: 01/20/21  9:59 PM  Result Value Ref Range   Total Protein 7.2 6.5 - 8.1 g/dL   Albumin 3.6 3.5 - 5.0 g/dL   AST 100 (H) 15 - 41 U/L   ALT 61 (H) 0 - 44 U/L   Alkaline Phosphatase 88 38 - 126 U/L   Total Bilirubin 0.7 0.3 - 1.2 mg/dL   Bilirubin, Direct <0.1 0.0 - 0.2 mg/dL   Indirect Bilirubin NOT CALCULATED 0.3 - 0.9 mg/dL   Sedimentation rate     Status: Abnormal   Collection Time: 01/20/21 10:30 PM  Result Value Ref Range   Sed Rate 70 (H) 0 - 22 mm/hr  C-reactive protein     Status: Abnormal   Collection Time: 01/20/21 10:42 PM  Result Value Ref Range   CRP 15.1 (H) <1.0 mg/dL   Imaging Studies: No results found.  ED COURSE and MDM  Nursing notes, initial and subsequent vitals signs, including pulse oximetry, reviewed and interpreted by myself.  Vitals:   01/20/21 2149 01/20/21 2230 01/20/21 2340 01/21/21 0022  BP: 122/90 132/76 (!) 110/91 126/64  Pulse: (!) 117 (!) 109 (!) 118 (!) 103  Resp: 20 16 (!) 32 19  Temp: 98 F (36.7 C)     TempSrc: Oral     SpO2: 98% 97% 96% 91%  Weight: 77.1 kg     Height: 5\' 2"  (1.575 m)      Medications  potassium chloride 10 mEq in 100 mL IVPB (10 mEq Intravenous New Bag/Given 01/21/21 0023)  0.9 %  sodium chloride infusion (has no administration in time range)  potassium chloride SA (KLOR-CON) CR tablet 40 mEq (40 mEq Oral Given 01/20/21 2335)  diazepam (VALIUM) injection 5 mg (5 mg Intravenous Given 01/21/21 0006)   12:58 AM No significant change in movement disorder with IV Valium.  Patient CK is elevated consistent with mild rhabdomyolysis.  The cause of the patient's dyskinesia is unclear but could be a reaction to ropinirole and/or other medications.  Given her profound hypokalemia and significant movement disorder we will have her admitted.  She will likely need an orthopedic consultation for her postoperative knee infection.  1:26 AM Dr. Luna Fuse to admit.  [facility] consulted for the patient's postoperative knee infection.   PROCEDURES  Procedures CRITICAL CARE Performed by: Karen Chafe Aubrea Meixner Total critical care time: 30 minutes Critical care time was exclusive of separately billable procedures and treating other patients. Critical care was necessary to treat or prevent imminent or life-threatening deterioration. Critical care was time spent  personally by me on the following activities: development of treatment plan with patient and/or surrogate as well as nursing, discussions with consultants, evaluation of patient's response to treatment, examination of patient, obtaining history from patient or surrogate, ordering and performing treatments and interventions, ordering and review of laboratory studies, ordering and review of radiographic studies, pulse oximetry and re-evaluation of patient's condition.   ED DIAGNOSES     ICD-10-CM   1. Hypokalemia  E87.6   2. Movement disorder  G25.9   3. Non-traumatic rhabdomyolysis  M62.82   4. Postoperative infection of knee [facility])  T81.49XA    M00.9        Yonael Tulloch, Jenny Reichmann, MD 01/21/21 0127

## 2021-01-20 NOTE — ED Notes (Signed)
Critical potassium of 2.1. Molpus, MD, made aware

## 2021-01-20 NOTE — ED Triage Notes (Signed)
Emergency Medicine Provider Triage Evaluation Note  Isabella Moore , a 45 y.o. female  was evaluated in triage.  Pt complains of right knee pain. States she had knee surgery 3 wks ago with Dr. Maureen Ralphs and has been on abx for her sxs but they have not improved. Reports intermittent fevers. She further states she is having urinary retention since 430 am this morning.   Review of Systems  Positive: Knee pain, fevers Negative: nv  Physical Exam  BP 122/90 (BP Location: Left Arm)   Pulse (!) 117   Temp 98 F (36.7 C) (Oral)   Resp 20   Ht 5\' 2"  (1.575 m)   Wt 77.1 kg   SpO2 98%   BMI 31.09 kg/m  Gen:   Awake, no distress   HEENT:  Atraumatic Resp:  Normal effort  Cardiac:  Normal rate  Abd:   Nondistended, MSK:   Right knee erythematous Neuro:  Speech clear   Medical Decision Making  Medically screening exam initiated at 9:56 PM.  Appropriate orders placed.  Isabella Moore was informed that the remainder of the evaluation will be completed by another provider, this initial triage assessment does not replace that evaluation, and the importance of remaining in the ED until their evaluation is complete.  Clinical Impression   45 y/o f presenting for eval of right knee pain and urinary retention  MSE was initiated and I personally evaluated the patient and placed orders (if any) at  9:59 PM on January 20, 2021.  The patient appears stable so that the remainder of the MSE may be completed by another provider.    Rodney Booze, [place] 01/20/21 2159

## 2021-01-20 NOTE — ED Provider Notes (Incomplete)
[facility] DEPT Provider Note: [name], MD, FACEP  CSN: [id number] MRN: [id number] ARRIVAL: 01/20/21 at 2142 ROOM: [place]  Post-op Problem   HISTORY OF PRESENT ILLNESS  01/20/21 11:45 PM Isabella Moore is a 45 y.o. female who had a right total knee arthroplasty on 12/31/2020 by Dr. Gaynelle Arabian.  6 days ago she developed erythema, increasing pain and fevers to just under 101.  Her orthopedist started her on Keflex 5 days ago.  Despite this she continues to have erythema and pain in her right knee with fever.  She rates her knee pain is a 9 out of 10, worse with attempted movement.  Since yesterday she has had semivoluntary movements of all of her muscles.  She is lying in her bed twitching and jerking.  She states she is able to control it temporarily but feels the need to constantly move.  She is on ropinirole for restless leg syndrome but states that this is not the same.  She states all of her muscles feel sore.  She states she has frequent insomnia but this is not new for her.  Soon after surgery she developed ulcerations of her lower lip which she was told was "an allergic reaction" but she does not know what she might have been allergic to.  She denies any ocular or genital mucosal involvement.  Prior to my evaluation of the patient she was found to have a potassium of 2.1 and orders were placed for oral and IV repletion.   Past Medical History:  Diagnosis Date  . Anxiety   . Arthritis   . 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 Atypical nevus 05/15/2011   moderate-lower back  . Atypical nevus 06/23/2013   mild- upper right back  . Atypical nevus 06/23/2013   moderate-lower right back,lower stomach  . Atypical nevus 08/02/2014   mild-left jawline, right back, left mid back, left lower mid back  . Atypical nevus 03/14/2015   moderate-left inner thigh  .  Atypical nevus 12/13/2015   mild-right side superior  . Atypical nevus 07/30/2016   moderate-lower abdomen  . Cancer ([place])    skin  . Hypothyroidism   . Pneumonia     Past Surgical History:  Procedure Laterality Date  . ABDOMINAL HYSTERECTOMY    . CESAREAN SECTION     x two  . CHOLECYSTECTOMY    . GASTRIC BYPASS    . HERNIA REPAIR     ventral  . I & D EXTREMITY     abdomen  . NECK SURGERY     C 4-5  . SLEEVE GASTROPLASTY    . TENDON REPAIR Left   . TOTAL KNEE ARTHROPLASTY Right 12/31/2020   Procedure: TOTAL KNEE ARTHROPLASTY;  Surgeon: Gaynelle Arabian, MD;  Location: [facility];  Service: Orthopedics;  Laterality: Right;  24min    No family history on file.  Social History   Tobacco Use  . Smoking status: Never Smoker  . Smokeless tobacco: Never Used  Vaping Use  . Vaping Use: Never used  Substance Use Topics  . Alcohol use: Never  . Drug use: Never    Prior to Admission medications   Medication Sig Start Date End Date Taking? Authorizing Provider  Calcium Carbonate (CALCI-CHEW PO) Take 1 tablet by mouth in the morning and at bedtime.    [provider]  diphenhydrAMINE (BENADRYL) 25 mg capsule Take  25 mg by mouth every 6 (six) hours as needed for allergies.    [provider]  DULoxetine (CYMBALTA) 60 MG capsule Take 60 mg by mouth every morning. 11/27/20   [provider]  furosemide (LASIX) 80 MG tablet Take 80 mg by mouth 2 (two) times daily. 12/07/20   [provider]  HYDROmorphone (DILAUDID) 2 MG tablet Take 1-2 tablets (2-4 mg total) by mouth every 6 (six) hours as needed for severe pain. 01/01/21   Edmisten, Kristie L, PA  LINZESS 290 MCG CAPS capsule Take 290 mcg by mouth in the morning. 11/04/20   [provider]  methocarbamol (ROBAXIN) 500 MG tablet Take 1 tablet (500 mg total) by mouth every 6 (six) hours as needed for muscle spasms. 01/01/21   Fenton Foy D, PA-C  Multiple Vitamins-Minerals (BARIATRIC FUSION PO)  Take 1 tablet by mouth in the morning and at bedtime.    [provider]  PARoxetine (PAXIL) 40 MG tablet Take 10 mg by mouth at bedtime. 0.25 tablet    [provider]  pregabalin (LYRICA) 50 MG capsule Take 50 mg by mouth 2 (two) times daily. 11/24/20   [provider]  promethazine (PHENERGAN) 25 MG tablet Take 25 mg by mouth every 6 (six) hours as needed for nausea/vomiting. 08/03/20   [provider]  Pyridoxine HCl (B-6 PO) Place 1 drop under the tongue in the morning and at bedtime.    [provider]  rivaroxaban (XARELTO) 10 MG TABS tablet Take 1 tablet (10 mg total) by mouth daily with breakfast for 20 days. 01/01/21 01/21/21  Edmisten, Ok Anis, PA  ropinirole (REQUIP) 5 MG tablet Take 5 mg by mouth in the morning, at noon, and at bedtime. 12/08/20   [provider]  spironolactone (ALDACTONE) 50 MG tablet Take 50 mg by mouth in the morning. 12/01/20   [provider]  tiZANidine (ZANAFLEX) 4 MG tablet Take 4 mg by mouth in the morning, at noon, and at bedtime. 11/21/20   [provider]  traMADol (ULTRAM) 50 MG tablet Take 50 mg by mouth every 6 (six) hours as needed (pain).    [provider]    Allergies Codeine, Latex, Morphine, Pork allergy, Ondansetron, Oxycodone, and Wound dressing adhesive   REVIEW OF SYSTEMS  Negative except as noted here or in the History of Present Illness.   PHYSICAL EXAMINATION  Initial Vital Signs Blood pressure 132/76, pulse (!) 109, temperature 98 F (36.7 C), temperature source Oral, resp. rate 16, height 5\' 2"  (1.575 m), weight 77.1 kg, SpO2 97 %.  Examination General: Well-developed, well-nourished female in no acute distress; appearance consistent with age of record HENT: normocephalic; atraumatic; shallow ulcerations of lower lip:    Eyes: pupils equal, round and reactive to light; extraocular muscles intact Neck: supple Heart: regular rate and rhythm;  tachycardia Lungs: clear to auscultation bilaterally Abdomen: soft; nondistended; nontender; bowel sounds present Extremities: No deformity; pulses normal; erythema and tenderness of right knee around surgical site:    Neurologic: Awake, alert and oriented; motor function intact in all extremities; continuous movement of all limbs and facial muscles with only partial voluntary ability to suppress this  Skin: Warm and dry Psychiatric: Normal mood and affect   RESULTS  Summary of this visit's results, reviewed and interpreted by myself:   EKG Interpretation  Date/Time:  Sunday January 20 2021 23:32:57 EDT Ventricular Rate:  116 PR Interval:  142 QRS Duration: 96 QT Interval:  345 QTC Calculation:  480 R Axis:   28 Text Interpretation: Sinus tachycardia Probable left atrial enlargement Borderline ST depression, lateral leads No previous ECGs available Confirmed by Molpus, Jenny Reichmann [phone]) on 01/20/2021 11:44:54 PM      Laboratory Studies: Results for orders placed or performed during the hospital encounter of 01/20/21 (from the past 24 hour(s))  CBC with Differential     Status: Abnormal   Collection Time: 01/20/21  9:59 PM  Result Value Ref Range   WBC 11.4 (H) 4.0 - 10.5 K/uL   RBC 3.52 (L) 3.87 - 5.11 MIL/uL   Hemoglobin 9.9 (L) 12.0 - 15.0 g/dL   HCT 30.4 (L) 36.0 - 46.0 %   MCV 86.4 80.0 - 100.0 fL   MCH 28.1 26.0 - 34.0 pg   MCHC 32.6 30.0 - 36.0 g/dL   RDW 14.8 11.5 - 15.5 %   Platelets 641 (H) 150 - 400 K/uL   nRBC 0.0 0.0 - 0.2 %   Neutrophils Relative % 67 %   Neutro Abs 7.6 1.7 - 7.7 K/uL   Lymphocytes Relative 17 %   Lymphs Abs 1.9 0.7 - 4.0 K/uL   Monocytes Relative 9 %   Monocytes Absolute 1.1 (H) 0.1 - 1.0 K/uL   Eosinophils Relative 6 %   Eosinophils Absolute 0.7 (H) 0.0 - 0.5 K/uL   Basophils Relative 1 %   Basophils Absolute 0.1 0.0 - 0.1 K/uL   Immature Granulocytes 0 %   Abs Immature Granulocytes 0.03 0.00 - 0.07 K/uL  Basic metabolic panel     Status:  Abnormal   Collection Time: 01/20/21  9:59 PM  Result Value Ref Range   Sodium 137 135 - 145 mmol/L   Potassium 2.1 (LL) 3.5 - 5.1 mmol/L   Chloride 92 (L) 98 - 111 mmol/L   CO2 29 22 - 32 mmol/L   Glucose, Bld 107 (H) 70 - 99 mg/dL   BUN 16 6 - 20 mg/dL   Creatinine, Ser 1.18 (H) 0.44 - 1.00 mg/dL   Calcium 8.6 (L) 8.9 - 10.3 mg/dL   GFR, Estimated 58 (L) >60 mL/min   Anion gap 16 (H) 5 - 15  Urinalysis, Routine w reflex microscopic Urine, Clean Catch     Status: Abnormal   Collection Time: 01/20/21  9:59 PM  Result Value Ref Range   Color, Urine YELLOW YELLOW   APPearance HAZY (A) CLEAR   Specific Gravity, Urine 1.006 1.005 - 1.030   pH 6.0 5.0 - 8.0   Glucose, UA NEGATIVE NEGATIVE mg/dL   Hgb urine dipstick MODERATE (A) NEGATIVE   Bilirubin Urine NEGATIVE NEGATIVE   Ketones, ur NEGATIVE NEGATIVE mg/dL   Protein, ur NEGATIVE NEGATIVE mg/dL   Nitrite NEGATIVE NEGATIVE   Leukocytes,Ua NEGATIVE NEGATIVE   RBC / HPF 0-5 0 - 5 RBC/hpf   WBC, UA 0-5 0 - 5 WBC/hpf   Bacteria, UA RARE (A) NONE SEEN   Squamous Epithelial / LPF 11-20 0 - 5   Hyaline Casts, UA PRESENT   Pregnancy, urine     Status: None   Collection Time: 01/20/21  9:59 PM  Result Value Ref Range   Preg Test, Ur NEGATIVE NEGATIVE  C-reactive protein     Status: Abnormal   Collection Time: 01/20/21 10:42 PM  Result Value Ref Range   CRP 15.1 (H) <1.0 mg/dL   Imaging Studies: No results found.  ED COURSE and MDM  Nursing notes, initial and subsequent vitals signs, including pulse oximetry, reviewed and interpreted by myself.  Vitals:   01/20/21 2149 01/20/21 2230  BP: 122/90 132/76  Pulse: (!) 117 (!) 109  Resp: 20 16  Temp: 98 F (36.7 C)   TempSrc: Oral   SpO2: 98% 97%  Weight: 77.1 kg   Height: 5\' 2"  (1.575 m)    Medications  potassium chloride 10 mEq in 100 mL IVPB (10 mEq Intravenous New Bag/Given 01/20/21 2335)  diazepam (VALIUM) injection 5 mg (has no administration in time range)  potassium  chloride SA (KLOR-CON) CR tablet 40 mEq (40 mEq Oral Given 01/20/21 2335)      PROCEDURES  Procedures   ED DIAGNOSES  No diagnosis found.

## 2021-01-21 ENCOUNTER — Inpatient Hospital Stay (HOSPITAL_COMMUNITY): Payer: Medicare Other

## 2021-01-21 DIAGNOSIS — X100XXA Contact with hot drinks, initial encounter: Secondary | ICD-10-CM | POA: Diagnosis present

## 2021-01-21 DIAGNOSIS — R748 Abnormal levels of other serum enzymes: Secondary | ICD-10-CM

## 2021-01-21 DIAGNOSIS — Z79899 Other long term (current) drug therapy: Secondary | ICD-10-CM | POA: Diagnosis not present

## 2021-01-21 DIAGNOSIS — M6282 Rhabdomyolysis: Secondary | ICD-10-CM

## 2021-01-21 DIAGNOSIS — K912 Postsurgical malabsorption, not elsewhere classified: Secondary | ICD-10-CM | POA: Diagnosis present

## 2021-01-21 DIAGNOSIS — Z885 Allergy status to narcotic agent status: Secondary | ICD-10-CM | POA: Diagnosis not present

## 2021-01-21 DIAGNOSIS — Y831 Surgical operation with implant of artificial internal device as the cause of abnormal reaction of the patient, or of later complication, without mention of misadventure at the time of the procedure: Secondary | ICD-10-CM | POA: Diagnosis present

## 2021-01-21 DIAGNOSIS — D649 Anemia, unspecified: Secondary | ICD-10-CM

## 2021-01-21 DIAGNOSIS — T8453XA Infection and inflammatory reaction due to internal right knee prosthesis, initial encounter: Secondary | ICD-10-CM | POA: Diagnosis present

## 2021-01-21 DIAGNOSIS — J189 Pneumonia, unspecified organism: Secondary | ICD-10-CM | POA: Diagnosis not present

## 2021-01-21 DIAGNOSIS — G4733 Obstructive sleep apnea (adult) (pediatric): Secondary | ICD-10-CM | POA: Diagnosis present

## 2021-01-21 DIAGNOSIS — G259 Extrapyramidal and movement disorder, unspecified: Secondary | ICD-10-CM | POA: Diagnosis not present

## 2021-01-21 DIAGNOSIS — E876 Hypokalemia: Secondary | ICD-10-CM

## 2021-01-21 DIAGNOSIS — T8149XA Infection following a procedure, other surgical site, initial encounter: Secondary | ICD-10-CM

## 2021-01-21 DIAGNOSIS — K219 Gastro-esophageal reflux disease without esophagitis: Secondary | ICD-10-CM | POA: Diagnosis present

## 2021-01-21 DIAGNOSIS — G2581 Restless legs syndrome: Secondary | ICD-10-CM | POA: Diagnosis present

## 2021-01-21 DIAGNOSIS — G4700 Insomnia, unspecified: Secondary | ICD-10-CM | POA: Diagnosis present

## 2021-01-21 DIAGNOSIS — T280XXA Burn of mouth and pharynx, initial encounter: Secondary | ICD-10-CM

## 2021-01-21 DIAGNOSIS — F32A Depression, unspecified: Secondary | ICD-10-CM | POA: Diagnosis present

## 2021-01-21 DIAGNOSIS — Z91018 Allergy to other foods: Secondary | ICD-10-CM | POA: Diagnosis not present

## 2021-01-21 DIAGNOSIS — N179 Acute kidney failure, unspecified: Secondary | ICD-10-CM | POA: Diagnosis present

## 2021-01-21 DIAGNOSIS — T428X5A Adverse effect of antiparkinsonism drugs and other central muscle-tone depressants, initial encounter: Secondary | ICD-10-CM | POA: Diagnosis present

## 2021-01-21 DIAGNOSIS — F419 Anxiety disorder, unspecified: Secondary | ICD-10-CM

## 2021-01-21 DIAGNOSIS — Z91040 Latex allergy status: Secondary | ICD-10-CM | POA: Diagnosis not present

## 2021-01-21 DIAGNOSIS — Z20822 Contact with and (suspected) exposure to covid-19: Secondary | ICD-10-CM | POA: Diagnosis present

## 2021-01-21 DIAGNOSIS — R259 Unspecified abnormal involuntary movements: Secondary | ICD-10-CM

## 2021-01-21 DIAGNOSIS — G249 Dystonia, unspecified: Secondary | ICD-10-CM

## 2021-01-21 DIAGNOSIS — G254 Drug-induced chorea: Secondary | ICD-10-CM | POA: Diagnosis present

## 2021-01-21 DIAGNOSIS — G9341 Metabolic encephalopathy: Secondary | ICD-10-CM | POA: Diagnosis not present

## 2021-01-21 DIAGNOSIS — E8770 Fluid overload, unspecified: Secondary | ICD-10-CM | POA: Diagnosis not present

## 2021-01-21 DIAGNOSIS — R339 Retention of urine, unspecified: Secondary | ICD-10-CM | POA: Diagnosis present

## 2021-01-21 DIAGNOSIS — Z888 Allergy status to other drugs, medicaments and biological substances status: Secondary | ICD-10-CM | POA: Diagnosis not present

## 2021-01-21 LAB — CK
Total CK: 3181 U/L — ABNORMAL HIGH (ref 38–234)
Total CK: 3339 U/L — ABNORMAL HIGH (ref 38–234)

## 2021-01-21 LAB — BASIC METABOLIC PANEL
Anion gap: 19 — ABNORMAL HIGH (ref 5–15)
BUN: 14 mg/dL (ref 6–20)
CO2: 24 mmol/L (ref 22–32)
Calcium: 7.9 mg/dL — ABNORMAL LOW (ref 8.9–10.3)
Chloride: 95 mmol/L — ABNORMAL LOW (ref 98–111)
Creatinine, Ser: 0.89 mg/dL (ref 0.44–1.00)
GFR, Estimated: >60 mL/min (ref >60)
Glucose, Bld: 88 mg/dL (ref 70–99)
Potassium: 2.7 mmol/L — CL (ref 3.5–5.1)
Sodium: 138 mmol/L (ref 135–145)

## 2021-01-21 LAB — HEPATIC FUNCTION PANEL
ALT: 61 U/L — ABNORMAL HIGH (ref 0–44)
AST: 100 U/L — ABNORMAL HIGH (ref 15–41)
Albumin: 3.6 g/dL (ref 3.5–5.0)
Alkaline Phosphatase: 88 U/L (ref 38–126)
Bilirubin, Direct: <0.1 mg/dL (ref 0.0–0.2)
Total Bilirubin: 0.7 mg/dL (ref 0.3–1.2)
Total Protein: 7.2 g/dL (ref 6.5–8.1)

## 2021-01-21 LAB — COMPREHENSIVE METABOLIC PANEL
ALT: 59 U/L — ABNORMAL HIGH (ref 0–44)
AST: 96 U/L — ABNORMAL HIGH (ref 15–41)
Albumin: 3.2 g/dL — ABNORMAL LOW (ref 3.5–5.0)
Alkaline Phosphatase: 80 U/L (ref 38–126)
Anion gap: 14 (ref 5–15)
BUN: 13 mg/dL (ref 6–20)
CO2: 27 mmol/L (ref 22–32)
Calcium: 8 mg/dL — ABNORMAL LOW (ref 8.9–10.3)
Chloride: 97 mmol/L — ABNORMAL LOW (ref 98–111)
Creatinine, Ser: 0.84 mg/dL (ref 0.44–1.00)
GFR, Estimated: >60 mL/min (ref >60)
Glucose, Bld: 89 mg/dL (ref 70–99)
Potassium: 2.6 mmol/L — CL (ref 3.5–5.1)
Sodium: 138 mmol/L (ref 135–145)
Total Bilirubin: 1 mg/dL (ref 0.3–1.2)
Total Protein: 6.5 g/dL (ref 6.5–8.1)

## 2021-01-21 LAB — MAGNESIUM
Magnesium: 2.4 mg/dL (ref 1.7–2.4)
Magnesium: 2.1 mg/dL (ref 1.7–2.4)

## 2021-01-21 LAB — CBC
HCT: 29.6 % — ABNORMAL LOW (ref 36.0–46.0)
Hemoglobin: 9.5 g/dL — ABNORMAL LOW (ref 12.0–15.0)
MCH: 28.3 pg (ref 26.0–34.0)
MCHC: 32.1 g/dL (ref 30.0–36.0)
MCV: 88.1 fL (ref 80.0–100.0)
Platelets: 608 10*3/uL — ABNORMAL HIGH (ref 150–400)
RBC: 3.36 MIL/uL — ABNORMAL LOW (ref 3.87–5.11)
RDW: 14.8 % (ref 11.5–15.5)
WBC: 11.9 10*3/uL — ABNORMAL HIGH (ref 4.0–10.5)
nRBC: 0 % (ref 0.0–0.2)

## 2021-01-21 LAB — IRON AND TIBC
Iron: 32 ug/dL (ref 28–170)
Iron: 33 ug/dL (ref 28–170)
Saturation Ratios: 11 % (ref 10.4–31.8)
Saturation Ratios: 11 % (ref 10.4–31.8)
TIBC: 279 ug/dL (ref 250–450)
TIBC: 290 ug/dL (ref 250–450)
UIBC: 247 ug/dL
UIBC: 257 ug/dL

## 2021-01-21 LAB — FERRITIN
Ferritin: 192 ng/mL (ref 11–307)
Ferritin: 198 ng/mL (ref 11–307)

## 2021-01-21 LAB — SEDIMENTATION RATE: Sed Rate: 70 mm/hr — ABNORMAL HIGH (ref 0–22)

## 2021-01-21 LAB — SARS CORONAVIRUS 2 (TAT 6-24 HRS): SARS Coronavirus 2: NEGATIVE

## 2021-01-21 LAB — FOLATE: Folate: 11 ng/mL

## 2021-01-21 LAB — RETICULOCYTES
Immature Retic Fract: 26.7 % — ABNORMAL HIGH (ref 2.3–15.9)
RBC.: 3.7 MIL/uL — ABNORMAL LOW (ref 3.87–5.11)
Retic Count, Absolute: 148.4 10*3/uL (ref 19.0–186.0)
Retic Ct Pct: 4 % — ABNORMAL HIGH (ref 0.4–3.1)

## 2021-01-21 LAB — TSH: TSH: 1.133 u[IU]/mL (ref 0.350–4.500)

## 2021-01-21 LAB — HIV ANTIBODY (ROUTINE TESTING W REFLEX): HIV Screen 4th Generation wRfx: NONREACTIVE

## 2021-01-21 LAB — VITAMIN B12: Vitamin B-12: 641 pg/mL (ref 180–914)

## 2021-01-21 MED ORDER — VANCOMYCIN HCL 500 MG/100ML IV SOLN
500.0000 mg | Freq: Two times a day (BID) | INTRAVENOUS | Status: DC
  Administered 2021-01-21 – 2021-01-23 (×3): 500 mg via INTRAVENOUS
  Filled 2021-01-21 (×7): qty 100

## 2021-01-21 MED ORDER — POLYETHYLENE GLYCOL 3350 17 G PO PACK: 17.0000 g | PACK | Freq: Every day | ORAL | Status: DC | PRN

## 2021-01-21 MED ORDER — SODIUM CHLORIDE 0.9 % IV SOLN: INTRAVENOUS | Status: DC

## 2021-01-21 MED ORDER — POTASSIUM CHLORIDE CRYS ER 20 MEQ PO TBCR
40.0000 meq | EXTENDED_RELEASE_TABLET | ORAL | Status: AC
  Administered 2021-01-21 (×2): 40 meq via ORAL
  Filled 2021-01-21 (×2): qty 2

## 2021-01-21 MED ORDER — HYDROMORPHONE HCL 1 MG/ML IJ SOLN
1.0000 mg | Freq: Once | INTRAMUSCULAR | Status: AC | PRN
  Administered 2021-01-21: 1 mg via INTRAVENOUS
  Filled 2021-01-21: qty 1

## 2021-01-21 MED ORDER — DIPHENHYDRAMINE HCL 25 MG PO CAPS: 25.0000 mg | ORAL_CAPSULE | Freq: Four times a day (QID) | ORAL | Status: DC | PRN

## 2021-01-21 MED ORDER — LIP MEDEX EX OINT
TOPICAL_OINTMENT | CUTANEOUS | Status: AC
  Filled 2021-01-21: qty 7

## 2021-01-21 MED ORDER — PROMETHAZINE HCL 12.5 MG PO TABS
12.5000 mg | ORAL_TABLET | Freq: Four times a day (QID) | ORAL | Status: DC | PRN
  Administered 2021-01-21: 12.5 mg via ORAL
  Filled 2021-01-21 (×2): qty 1

## 2021-01-21 MED ORDER — LORAZEPAM 2 MG/ML IJ SOLN
2.0000 mg | Freq: Once | INTRAMUSCULAR | Status: DC | PRN
  Filled 2021-01-21: qty 1

## 2021-01-21 MED ORDER — SODIUM CHLORIDE 0.9 % IV SOLN: Freq: Once | INTRAVENOUS | Status: AC

## 2021-01-21 MED ORDER — LINACLOTIDE 145 MCG PO CAPS
290.0000 ug | ORAL_CAPSULE | Freq: Every morning | ORAL | Status: DC
  Administered 2021-01-22 – 2021-01-25 (×3): 290 ug via ORAL
  Filled 2021-01-21 (×5): qty 2

## 2021-01-21 MED ORDER — ROPINIROLE HCL 0.5 MG PO TABS: 0.5000 mg | ORAL_TABLET | Freq: Every day | ORAL | Status: DC

## 2021-01-21 MED ORDER — ALBUTEROL SULFATE (2.5 MG/3ML) 0.083% IN NEBU: 2.5000 mg | INHALATION_SOLUTION | Freq: Four times a day (QID) | RESPIRATORY_TRACT | Status: DC | PRN

## 2021-01-21 MED ORDER — VANCOMYCIN HCL 750 MG/150ML IV SOLN: 750.0000 mg | INTRAVENOUS | Status: DC

## 2021-01-21 MED ORDER — ROPINIROLE HCL 0.5 MG PO TABS
0.5000 mg | ORAL_TABLET | Freq: Three times a day (TID) | ORAL | Status: DC
  Administered 2021-01-21: 0.5 mg via ORAL
  Filled 2021-01-21: qty 1

## 2021-01-21 MED ORDER — CLONAZEPAM 0.5 MG PO TABS
0.5000 mg | ORAL_TABLET | Freq: Two times a day (BID) | ORAL | Status: DC
  Administered 2021-01-21 – 2021-01-22 (×3): 0.5 mg via ORAL
  Filled 2021-01-21 (×3): qty 1

## 2021-01-21 MED ORDER — POTASSIUM CHLORIDE CRYS ER 20 MEQ PO TBCR
40.0000 meq | EXTENDED_RELEASE_TABLET | Freq: Once | ORAL | Status: AC
  Administered 2021-01-21: 40 meq via ORAL
  Filled 2021-01-21: qty 2

## 2021-01-21 MED ORDER — ACETAMINOPHEN 650 MG RE SUPP: 650.0000 mg | Freq: Four times a day (QID) | RECTAL | Status: DC | PRN

## 2021-01-21 MED ORDER — VANCOMYCIN HCL 1500 MG/300ML IV SOLN
1500.0000 mg | INTRAVENOUS | Status: AC
  Administered 2021-01-21: 1500 mg via INTRAVENOUS
  Filled 2021-01-21: qty 300

## 2021-01-21 MED ORDER — MAGIC MOUTHWASH W/LIDOCAINE
5.0000 mL | Freq: Three times a day (TID) | ORAL | Status: DC | PRN
  Administered 2021-01-22 – 2021-01-23 (×3): 5 mL via ORAL
  Filled 2021-01-21 (×5): qty 5

## 2021-01-21 MED ORDER — ROPINIROLE HCL 0.5 MG PO TABS: 0.5000 mg | ORAL_TABLET | Freq: Three times a day (TID) | ORAL | Status: DC

## 2021-01-21 MED ORDER — ACETAMINOPHEN 325 MG PO TABS
650.0000 mg | ORAL_TABLET | Freq: Four times a day (QID) | ORAL | Status: DC | PRN
  Administered 2021-01-21 – 2021-01-25 (×3): 650 mg via ORAL
  Filled 2021-01-21 (×3): qty 2

## 2021-01-21 MED ORDER — HYDROXYZINE HCL 25 MG PO TABS
25.0000 mg | ORAL_TABLET | Freq: Once | ORAL | Status: AC
  Administered 2021-01-21: 25 mg via ORAL
  Filled 2021-01-21: qty 1

## 2021-01-21 MED ORDER — HYDROMORPHONE HCL 2 MG PO TABS
2.0000 mg | ORAL_TABLET | ORAL | Status: DC | PRN
  Administered 2021-01-21 – 2021-01-24 (×8): 2 mg via ORAL
  Filled 2021-01-21 (×8): qty 1

## 2021-01-21 MED ORDER — DULOXETINE HCL 60 MG PO CPEP
60.0000 mg | ORAL_CAPSULE | Freq: Every morning | ORAL | Status: DC
  Administered 2021-01-21 – 2021-01-26 (×4): 60 mg via ORAL
  Filled 2021-01-21 (×4): qty 1

## 2021-01-21 NOTE — Progress Notes (Signed)
Pharmacy Antibiotic Note  Isabella Moore is a 45 y.o. female admitted on 01/20/2021 with cellulitisof right knee.  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  Pharmacy has been consulted for Vancomycin dosing.  Plan: Vancomycin 1500mg  IV x 1 loading dose followed by Vancomycin 750 mg IV Q 24 hrs. Goal AUC 400-550.  Expected AUC: 447.2  SCr used: 1.18  Follow renal function  Monitor vancomycin levels as needed  Height: 5\' 2"  (157.5 cm) Weight: 77.1 kg (170 lb) IBW/kg (Calculated) : 50.1  Temp (24hrs), Avg:98 F (36.7 C), Min:98 F (36.7 C), Max:98 F (36.7 C)  Recent Labs  Lab 01/20/21 2159  WBC 11.4*  CREATININE 1.18*    Estimated Creatinine Clearance: 57.3 mL/min (A) (by C-G formula based on SCr of 1.18 mg/dL (H)).    Allergies  Allergen Reactions  . Codeine Hives, Nausea And Vomiting, Rash and Other (See Comments)    hallucinations   . Latex Hives and Rash  . Morphine Hives, Rash and Other (See Comments)    hallucinations   . Pork Allergy Anaphylaxis  . Ondansetron Other (See Comments)    Severe headaches   . Oxycodone     dizziness  . Wound Dressing Adhesive Rash    Antimicrobials this admission: 4/18 Vancomycin >>     Dose adjustments this admission:   Microbiology results:  Thank you for allowing pharmacy to be a part of this patient's care.  Everette Rank, PharmD 01/21/2021 2:15 AM

## 2021-01-21 NOTE — Progress Notes (Signed)
Hospitalist floor coverage paged about pt spastically moving constantly and can not relax her body. New order given.
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
## 2021-01-21 NOTE — Consult Note (Signed)
Neurology Consultation  Reason for Consult: Uncontrollable generalized musculoskeletal movements  Referring Physician: Dr. Cyndia Skeeters  CC: Severe neck and back pain, restless generalized musculoskeletal movements  History is obtained from: Patient, Chart review, Patient's husband at bedside  HPI: Isabella Moore is a 45 y.o. female with a medical history signficant for anxiety, hypothyroidism (diagnosed as secondary to Hashimoto's thyroiditis, but with some disagreement between the patient's Rheumatologist and Endocrinologist, per patient), restless leg syndrome, and a recent hospitalization in March 2022 for a right total knee replacement, s/p infection on Keflex outpatient, who presented to [facility] for evaluation of right knee pain and restless generalized body movements. [name]'s husband noted increased restless movements starting on Saturday 4/16, lack of sleep since Thursday 4/14, and an abrasion on her bottom lip with progressive right knee pain prompting further evaluation and admission. He expresses concern over possible sepsis from her right knee.   On initial work up, [name] was found to have an elevated CK of 3,339, hypokalemia K- 2.7, and a WBC count of 11.9K with an elevated CRP of 15.1. She claims to have pain all over, chronic neck pain, and back pain which are worsened since her operation. She states that her abnormal movements are worsened with anxiety and lack of sleep. When asked if she is able to stop the movements if she chooses, she is able to stop the generalized musculoskeletal movements briefly and lie still within the bed, but the movements quickly come back. She seems less aware of them than her husband, friend and Neurologist who is interviewing her. She states that the movements feel like they are associated with anxiety and pain. At baseline, [name] does have restless leg syndrome and paces frequently until she is exhausted and able to sleep, but endorses that her  current restless movements are not characteristic of her baseline restless leg syndrome. Additionally, the head/neck, upper extremity and truncal writhing movements she is experiencing are new.   ROS: A complete ROS was performed and is negative except as noted in the HPI.   Past Medical History:  Diagnosis Date  . Anxiety   . Arthritis   . 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 Atypical nevus 05/15/2011   moderate-lower back  . Atypical nevus 06/23/2013   mild- upper right back  . Atypical nevus 06/23/2013   moderate-lower right back,lower stomach  . Atypical nevus 08/02/2014   mild-left jawline, right back, left mid back, left lower mid back  . Atypical nevus 03/14/2015   moderate-left inner thigh  . Atypical nevus 12/13/2015   mild-right side superior  . Atypical nevus 07/30/2016   moderate-lower abdomen  . Cancer ([place])    skin  . Hypothyroidism   . Pneumonia     Past Surgical History:  Procedure Laterality Date  . ABDOMINAL HYSTERECTOMY    . CESAREAN SECTION     x two  . CHOLECYSTECTOMY    . GASTRIC BYPASS    . HERNIA REPAIR     ventral  . I & D EXTREMITY     abdomen  . NECK SURGERY     C 4-5  . SLEEVE GASTROPLASTY    . TENDON REPAIR Left   . TOTAL KNEE ARTHROPLASTY Right 12/31/2020   Procedure: TOTAL KNEE ARTHROPLASTY;  Surgeon: Gaynelle Arabian, MD;  Location: [facility];  Service: Orthopedics;  Laterality: Right;  37mn   No family history on file.  Social History:   reports that she has never smoked. She has never used smokeless tobacco. She reports that she does not drink alcohol and does not use drugs.  Medications  Current Facility-Administered Medications:  .  0.9 %  sodium chloride infusion, , Intravenous, Continuous, Gonfa, Taye T, MD, Last Rate: 150 mL/hr at 01/21/21 1201, New Bag at 01/21/21 1201 .  acetaminophen (TYLENOL) tablet 650 mg, 650 mg, Oral, Q6H PRN  **OR** acetaminophen (TYLENOL) suppository 650 mg, 650 mg, Rectal, Q6H PRN, MacNeil, Richard G, DO .  albuterol (PROVENTIL) (2.5 MG/3ML) 0.083% nebulizer solution 2.5 mg, 2.5 mg, Nebulization, Q6H PRN, MacNeil, Richard G, DO .  clonazePAM (KLONOPIN) tablet 0.5 mg, 0.5 mg, Oral, BID, Gonfa, Taye T, MD, 0.5 mg at 01/21/21 1027 .  diphenhydrAMINE (BENADRYL) capsule 25 mg, 25 mg, Oral, Q6H PRN, MacNeil, Richard G, DO .  DULoxetine (CYMBALTA) DR capsule 60 mg, 60 mg, Oral, q morning, Gonfa, Taye T, MD, 60 mg at 01/21/21 1343 .  HYDROmorphone (DILAUDID) tablet 2 mg, 2 mg, Oral, Q4H PRN, MacNeil, Richard G, DO, 2 mg at 01/21/21 1010 .  [START ON 01/22/2021] linaclotide (LINZESS) capsule 290 mcg, 290 mcg, Oral, q AM, Gonfa, Taye T, MD .  LORazepam (ATIVAN) injection 2 mg, 2 mg, Intravenous, Once PRN, Gonfa, Taye T, MD .  polyethylene glycol (MIRALAX / GLYCOLAX) packet 17 g, 17 g, Oral, Daily PRN, MacNeil, Richard G, DO .  promethazine (PHENERGAN) tablet 12.5 mg, 12.5 mg, Oral, Q6H PRN, MacNeil, Richard G, DO, 12.5 mg at 01/21/21 1008 .  rOPINIRole (REQUIP) tablet 0.5 mg, 0.5 mg, Oral, TID, Gonfa, Taye T, MD .  vancomycin (VANCOREADY) IVPB 500 mg/100 mL, 500 mg, Intravenous, Q12H, Luiz Ochoa, RPH  Exam: Current vital signs: BP (!) 142/109 (BP Location: Left Arm)   Pulse 100   Temp 98.4 F (36.9 C) (Oral)   Resp 18   Ht _0  (1.575 m)   Wt 77.1 kg   SpO2 93%   BMI 31.09 kg/m  Vital signs in last 24 hours: Temp:  [98 F (36.7 C)-98.5 F (36.9 C)] 98.4 F (36.9 C) (04/18 1308) Pulse Rate:  [99-118] 100 (04/18 1308) Resp:  [16-32] 18 (04/18 1308) BP: (110-142)/(63-109) 142/109 (04/18 1308) SpO2:  [91 %-99 %] 93 % (04/18 1308) Weight:  [77.1 kg] 77.1 kg (04/17 2149)  GENERAL: Awake, alert, with generalized restless movements throughout assessment Psych: Appears anxious, cooperative with examination Head: Normocephalic and atraumatic without obvious abnormality EENT: Abrasion on right  lower lip without active hemorrhage (denies trauma / biting), normal conjunctivae, no OP obstruction LUNGS - Normal respiratory effort. Non-labored breathing CV: Tachycardia on cardiac monitor, extremities warm and without edema ABDOMEN: Soft, non-tender, non-distended Ext: reddened slightly raised incisional site that is approximated over the right patellae. Extremities without obvious deformity.   NEURO:  Mental Status: Awake, alert, and oriented to self, age, month, year, time, and situation. She is able to provide a clear and coherent history of present illness. Speech is intact without dysarthria or aphasia. Naming, repetition, fluency, and comprehension intact. No neglect is noted on examination. Cranial Nerves:  II: PERRL, 3 mm/brisk. Visual fields full.  III, IV, VI: EOMI. Lid elevation symmetric and full.   V: Sensation is intact to light touch and symmetrical to face.  VII: Face is symmetric resting and smiling.  VIII: Hearing is intact to voice IX, X: Palate elevation is symmetric. Phonation normal.  XI: Normal sternocleidomastoid and trapezius muscle strength XII: Tongue protrudes midline  without fasciculations.   Motor: 4/5 strength due to pain on the right lower extremity, 5/5 strength of bilateral upper extremities and left lower extremity with spontaneous and antigravity movement without vertical drift on assessment. Ankle DF/PF 5/5 bilaterally. Bulk is normal.  Prominent choreiform movements involving the upper and lower extremities, proximally and distally, as well as the trunk and neck are noted throughout the exam. The patient can briefly suppress the movements partially but not completely.  Sensation: Intact to light touch bilaterally in all four extremities.  Coordination: FTN intact bilaterally. No pronator drift. DTRs: 2+ left patellae, unable to appreciate DTR on right patellae (patient unable to relax right leg, 3 weeks s/p total right knee replacement) 2+ and symmetric  bilateral biceps Gait: Dance-like gait without instability or Romberg.   Labs I have reviewed labs in epic and the results pertinent to this consultation are: CBC    Component Value Date/Time   WBC 11.9 (H) 01/21/2021 0351   RBC 3.70 (L) 01/21/2021 1038   RBC 3.36 (L) 01/21/2021 0351   HGB 9.5 (L) 01/21/2021 0351   HCT 29.6 (L) 01/21/2021 0351   PLT 608 (H) 01/21/2021 0351   MCV 88.1 01/21/2021 0351   MCH 28.3 01/21/2021 0351   MCHC 32.1 01/21/2021 0351   RDW 14.8 01/21/2021 0351   LYMPHSABS 1.9 01/20/2021 2159   MONOABS 1.1 (H) 01/20/2021 2159   EOSABS 0.7 (H) 01/20/2021 2159   BASOSABS 0.1 01/20/2021 2159   CMP     Component Value Date/Time   NA 138 01/21/2021 0735   K 2.7 (LL) 01/21/2021 0735   CL 95 (L) 01/21/2021 0735   CO2 24 01/21/2021 0735   GLUCOSE 88 01/21/2021 0735   BUN 14 01/21/2021 0735   CREATININE 0.89 01/21/2021 0735   CALCIUM 7.9 (L) 01/21/2021 0735   PROT 6.5 01/21/2021 0351   ALBUMIN 3.2 (L) 01/21/2021 0351   AST 96 (H) 01/21/2021 0351   ALT 59 (H) 01/21/2021 0351   ALKPHOS 80 01/21/2021 0351   BILITOT 1.0 01/21/2021 0351   GFRNONAA >60 01/21/2021 0735   Lab Results  Component Value Date   CKTOTAL 3,339 (H) 01/21/2021   Lab Results  Component Value Date   VITAMINB12 641 01/21/2021   Lab Results  Component Value Date   TSH 1.133 01/21/2021   Ceruloplasmin, Copper, methylmalonic acid levels, and UDS pending   Imaging MRI brain pending  Assessment: 45 year old female with history as above who presented to the [facility] ED for evaluation of generalized restless musculoskeletal movements and right knee pain with concern for infection of recent total right knee replacement.  - Examination reveals patient with generalized restless movements reported to be worse with increased stress, anxiety, and lack of sleep. Patient is able to stop restless movements when asked but quickly resumes the movements again. The movements go away with sleep. Follow up  exam by Neurology attending reveals prominent choreiform movements involving the upper and lower extremities, proximally and distally, as well as the trunk and neck are noted throughout the exam. The patient can briefly suppress the movements partially but not completely. Also with a dance-like gait.  - Patient is on home Cymbalta but has been taking this for over one year without increasing restless movements. Cymbalta may have akathisia as a side effect. More concerning, patient is on Requip 0.5 mg TID dosing.  - Has an elevated CRP of 15.1, as well as an elevated ESR of 70, raising concern for a possible autoimmune process involving the  basal ganglia, which could result in acute onset of chorea.  - DDx for her chorea includes Hashimoto's encephalopathy (autoimmune), syphilis (unlikely) and medication side effect (ropinirole has listed potential side effect of chorea).  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 HIV negative.  - Her elevated CK of 3339 is most likely secondary to the essentially continuous choreiform movements.  - Brief literature search did not reveal case reports of a cephalexin-induced chorea - Diphenhydramine may be associated with development of chorea. - Duloxetine can be associated wit development of movement disorders, but unclear if chorea specifically.  - She denies methamphetamine or any other illicit drug use - Her new development of oral ulcers suggest a possible autoimmune syndrome as well. Behcet's syndrome can cause oral ulcers and in some patients can manifest with chorea (Neuro-Behcet's syndrome)  Recommendations: - MRI brain with and without contrast is pending, to assess for possible diffuse signal abnormalities in the basal ganglia (autoimmune) or subthalamic nucleus (stroke). She most likely will need to be asleep during the exam due to her chorea. Of note, chorea, as with other  basal ganglia mediated movement disorders, should stop while the patient is sleeping. - Fentanyl 12.5 mcg IV x 1 ordered at 1:00 AM to assess for possible pain-triggered etiology for her abnormal movements.   - RPR - Pending MRI results, may need an LP to assess for IgG index, oligoclonal bands and VDRL - May benefit from pulsed dose steroids x 5 days pending results of the above testing.  - Discontinuing ropinirole - For patients under the age of 101, a serum ceruloplasmin and 24 hour urine copper may be indicated as part of the movement disorders work up.    Pt seen by NP/Neuro   Anibal Henderson, AGAC-NP [facility] Pager: [phone]  I have seen and examined the patient. I have formulated the assessment and recommendations. 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 Exam reveals prominent choreiform movements. DDx and recommendations as above.  Electronically signed: Dr. Kerney Elbe

## 2021-01-21 NOTE — Progress Notes (Signed)
Pharmacy Antibiotic Note  Isabella Moore is a 45 y.o. female admitted on 01/20/2021 with possible post-surgical right knee infection s/p right TKA on 12/31/20.  Pharmacy has been consulted for Vancomycin dosing.  Plan: Adjust maintenance dose of Vancomycin to 500mg  IV q12h for goal AUC 400-550. (Expected AUC: 461, SCr used: 0.89).   Vancomycin levels at steady state, as indicated  Monitor renal function, cultures, clinical course  Height: 5\' 2"  (157.5 cm) Weight: 77.1 kg (170 lb) IBW/kg (Calculated) : 50.1  Temp (24hrs), Avg:98.3 F (36.8 C), Min:98 F (36.7 C), Max:98.5 F (36.9 C)  Recent Labs  Lab 01/20/21 2159 01/21/21 0351 01/21/21 0735  WBC 11.4* 11.9*  --   CREATININE 1.18* 0.84 0.89    Estimated Creatinine Clearance: 75.9 mL/min (by C-G formula based on SCr of 0.89 mg/dL).    Allergies  Allergen Reactions  . Codeine Hives, Nausea And Vomiting, Rash and Other (See Comments)    hallucinations   . Latex Hives and Rash  . Morphine Hives, Rash and Other (See Comments)    hallucinations   . Pork Allergy Anaphylaxis  . Ondansetron Other (See Comments)    Severe headaches   . Oxycodone     dizziness  . Clindamycin/Lincomycin Swelling and Rash    Welts, sores, and swelling in and around mouth  . Wound Dressing Adhesive Rash    Antimicrobials this admission: 4/18 Vancomycin >>     Microbiology results: 4/18 COVID: negative   Thank you for allowing pharmacy to be a part of this patient's care.   Lindell Spar, PharmD, BCPS Clinical Pharmacist  01/21/2021 2:44 PM

## 2021-01-21 NOTE — Progress Notes (Signed)
MD Opyd was paged on secured chat because patient is twisting and turning in bed from generalized pain 10/10, PRN PO Dilaudid 2mg  was given @2243 . She is requesting some of her home medications, on call MD notified.

## 2021-01-21 NOTE — Progress Notes (Signed)
Brought patient for her MRI Brain wo/w, multiple attempts to get images for patient. Images were non diagnostic. If patient needs MRI, she will need to be scanned under [facility] at [facility].

## 2021-01-21 NOTE — Progress Notes (Signed)
Couldn't get MRI brain with sedation. So transferring to [facility] for MRI brain with anesthesia. Anesthesia  notified. She may need a progress note early in the morning. Neurology notified about patient's transfer to [facility].  Has recent Right TKA by Dr. Wynelle Link from [facility]. Some concern about surgical sight infection proximally. [facility] called and notified. Haus (PA) to see patient. On Vanc for now.
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
## 2021-01-21 NOTE — Progress Notes (Signed)
Lab called for critical potassium level of 2.6.  Dr. Cyndia Skeeters was informed through page.

## 2021-01-21 NOTE — Progress Notes (Signed)
Attempted bladder scan on patient. Unable to locate bladder on scanner with patient moving around in bed. Pt voided 314ml clear yellow urine right before bladder scan attempt.

## 2021-01-22 ENCOUNTER — Encounter (HOSPITAL_COMMUNITY): Payer: Self-pay | Admitting: Internal Medicine

## 2021-01-22 ENCOUNTER — Encounter (HOSPITAL_COMMUNITY)
Admission: EM | Disposition: A | Discharge status: Home / Self Care | Payer: Self-pay | Source: Home / Self Care | Attending: Internal Medicine

## 2021-01-22 ENCOUNTER — Inpatient Hospital Stay (HOSPITAL_COMMUNITY): Payer: Medicare Other

## 2021-01-22 ENCOUNTER — Inpatient Hospital Stay (HOSPITAL_COMMUNITY): Payer: Medicare Other | Admitting: Certified Registered Nurse Anesthetist

## 2021-01-22 ENCOUNTER — Ambulatory Visit: Payer: Medicare (Managed Care) | Admitting: Physician Assistant

## 2021-01-22 ENCOUNTER — Other Ambulatory Visit: Payer: Self-pay

## 2021-01-22 ENCOUNTER — Ambulatory Visit (HOSPITAL_COMMUNITY)
Admit: 2021-01-22 | Discharge: 2021-01-22 | Disposition: A | Discharge status: Home / Self Care | Payer: Medicare Other | Attending: Student | Admitting: Student

## 2021-01-22 DIAGNOSIS — G259 Extrapyramidal and movement disorder, unspecified: Secondary | ICD-10-CM | POA: Diagnosis not present

## 2021-01-22 DIAGNOSIS — G9341 Metabolic encephalopathy: Secondary | ICD-10-CM | POA: Diagnosis not present

## 2021-01-22 DIAGNOSIS — N179 Acute kidney failure, unspecified: Secondary | ICD-10-CM

## 2021-01-22 HISTORY — PX: IR FLUORO GUIDED NEEDLE PLC ASPIRATION/INJECTION LOC: IMG2395

## 2021-01-22 HISTORY — PX: RADIOLOGY WITH ANESTHESIA: SHX6223

## 2021-01-22 LAB — COMPREHENSIVE METABOLIC PANEL
ALT: 76 U/L — ABNORMAL HIGH (ref 0–44)
AST: 111 U/L — ABNORMAL HIGH (ref 15–41)
Albumin: 3.2 g/dL — ABNORMAL LOW (ref 3.5–5.0)
Alkaline Phosphatase: 87 U/L (ref 38–126)
Anion gap: 12 (ref 5–15)
BUN: 8 mg/dL (ref 6–20)
CO2: 22 mmol/L (ref 22–32)
Calcium: 8.4 mg/dL — ABNORMAL LOW (ref 8.9–10.3)
Chloride: 104 mmol/L (ref 98–111)
Creatinine, Ser: 0.67 mg/dL (ref 0.44–1.00)
GFR, Estimated: >60 mL/min (ref >60)
Glucose, Bld: 98 mg/dL (ref 70–99)
Potassium: 3.9 mmol/L (ref 3.5–5.1)
Sodium: 138 mmol/L (ref 135–145)
Total Bilirubin: 0.9 mg/dL (ref 0.3–1.2)
Total Protein: 6.6 g/dL (ref 6.5–8.1)

## 2021-01-22 LAB — CSF CELL COUNT WITH DIFFERENTIAL
RBC Count, CSF: 1 /mm3 — ABNORMAL HIGH
RBC Count, CSF: 1 /mm3 — ABNORMAL HIGH
Tube #: 1
Tube #: 4
WBC, CSF: 1 /mm3 (ref 0–5)
WBC, CSF: 1 /mm3 (ref 0–5)

## 2021-01-22 LAB — CERULOPLASMIN: Ceruloplasmin: 40.6 mg/dL — ABNORMAL HIGH (ref 19.0–39.0)

## 2021-01-22 LAB — CK: Total CK: 2897 U/L — ABNORMAL HIGH (ref 38–234)

## 2021-01-22 LAB — AMMONIA: Ammonia: 49 umol/L — ABNORMAL HIGH (ref 9–35)

## 2021-01-22 LAB — CBC
HCT: 29 % — ABNORMAL LOW (ref 36.0–46.0)
Hemoglobin: 9.1 g/dL — ABNORMAL LOW (ref 12.0–15.0)
MCH: 28.4 pg (ref 26.0–34.0)
MCHC: 31.4 g/dL (ref 30.0–36.0)
MCV: 90.6 fL (ref 80.0–100.0)
Platelets: 509 10*3/uL — ABNORMAL HIGH (ref 150–400)
RBC: 3.2 MIL/uL — ABNORMAL LOW (ref 3.87–5.11)
RDW: 15.2 % (ref 11.5–15.5)
WBC: 9.5 10*3/uL (ref 4.0–10.5)
nRBC: 0.2 % (ref 0.0–0.2)

## 2021-01-22 LAB — HEMOGLOBIN A1C
Hgb A1c MFr Bld: 5 % (ref 4.8–5.6)
Mean Plasma Glucose: 96.8 mg/dL

## 2021-01-22 LAB — RPR: RPR Ser Ql: NONREACTIVE

## 2021-01-22 LAB — PROTEIN AND GLUCOSE, CSF
Glucose, CSF: 54 mg/dL (ref 40–70)
Total  Protein, CSF: 32 mg/dL (ref 15–45)

## 2021-01-22 LAB — GLUCOSE, CAPILLARY: Glucose-Capillary: 93 mg/dL (ref 70–99)

## 2021-01-22 LAB — MAGNESIUM: Magnesium: 2 mg/dL (ref 1.7–2.4)

## 2021-01-22 LAB — PHOSPHORUS: Phosphorus: 2.7 mg/dL (ref 2.5–4.6)

## 2021-01-22 SURGERY — MRI WITH ANESTHESIA
Anesthesia: General

## 2021-01-22 MED ORDER — ROCURONIUM BROMIDE 10 MG/ML (PF) SYRINGE
PREFILLED_SYRINGE | INTRAVENOUS | Status: DC | PRN
  Administered 2021-01-22: 100 mg via INTRAVENOUS

## 2021-01-22 MED ORDER — INSULIN ASPART 100 UNIT/ML ~~LOC~~ SOLN: 0.0000 [IU] | SUBCUTANEOUS | Status: DC

## 2021-01-22 MED ORDER — FENTANYL CITRATE (PF) 100 MCG/2ML IJ SOLN: 50.0000 ug | INTRAMUSCULAR | Status: DC | PRN

## 2021-01-22 MED ORDER — HYDROMORPHONE HCL 1 MG/ML IJ SOLN
1.0000 mg | Freq: Once | INTRAMUSCULAR | Status: AC | PRN
  Administered 2021-01-22: 1 mg via INTRAVENOUS
  Filled 2021-01-22: qty 1

## 2021-01-22 MED ORDER — LACTATED RINGERS IV SOLN: INTRAVENOUS | Status: DC

## 2021-01-22 MED ORDER — LIDOCAINE 2% (20 MG/ML) 5 ML SYRINGE
INTRAMUSCULAR | Status: DC | PRN
  Administered 2021-01-22: 100 mg via INTRAVENOUS

## 2021-01-22 MED ORDER — FENTANYL CITRATE (PF) 100 MCG/2ML IJ SOLN
12.5000 ug | Freq: Once | INTRAMUSCULAR | Status: AC
  Administered 2021-01-22: 12.5 ug via INTRAVENOUS
  Filled 2021-01-22: qty 2

## 2021-01-22 MED ORDER — LACTATED RINGERS IV SOLN: INTRAVENOUS | Status: DC | PRN

## 2021-01-22 MED ORDER — DIAZEPAM 5 MG/ML IJ SOLN: 5.0000 mg | Freq: Once | INTRAMUSCULAR | Status: DC

## 2021-01-22 MED ORDER — DEXMEDETOMIDINE (PRECEDEX) IN NS 20 MCG/5ML (4 MCG/ML) IV SYRINGE
PREFILLED_SYRINGE | INTRAVENOUS | Status: DC | PRN
  Administered 2021-01-22 (×2): 20 ug via INTRAVENOUS

## 2021-01-22 MED ORDER — GADOBUTROL 1 MMOL/ML IV SOLN
7.5000 mL | Freq: Once | INTRAVENOUS | Status: AC | PRN
  Administered 2021-01-22: 7.5 mL via INTRAVENOUS

## 2021-01-22 MED ORDER — PROPOFOL 10 MG/ML IV BOLUS
INTRAVENOUS | Status: DC | PRN
  Administered 2021-01-22: 200 mg via INTRAVENOUS

## 2021-01-22 MED ORDER — DIAZEPAM 5 MG/ML IJ SOLN: 2.5000 mg | Freq: Four times a day (QID) | INTRAMUSCULAR | Status: DC | PRN

## 2021-01-22 MED ORDER — LORAZEPAM 2 MG/ML IJ SOLN
2.0000 mg | Freq: Once | INTRAMUSCULAR | Status: AC
  Administered 2021-01-22: 2 mg via INTRAVENOUS
  Filled 2021-01-22: qty 1

## 2021-01-22 MED ORDER — HALOPERIDOL LACTATE 5 MG/ML IJ SOLN
5.0000 mg | Freq: Once | INTRAMUSCULAR | Status: AC
  Administered 2021-01-22: 5 mg via INTRAVENOUS
  Filled 2021-01-22: qty 1

## 2021-01-22 MED ORDER — LIDOCAINE HCL (PF) 1 % IJ SOLN
INTRAMUSCULAR | Status: AC
  Filled 2021-01-22: qty 30

## 2021-01-22 MED ORDER — CHLORHEXIDINE GLUCONATE CLOTH 2 % EX PADS
6.0000 | MEDICATED_PAD | Freq: Every day | CUTANEOUS | Status: DC
  Administered 2021-01-22 – 2021-01-24 (×3): 6 via TOPICAL

## 2021-01-22 MED ORDER — DIAZEPAM 5 MG/ML IJ SOLN
INTRAMUSCULAR | Status: AC
  Filled 2021-01-22: qty 2

## 2021-01-22 NOTE — Progress Notes (Signed)
Critical care MD to eval patient see new orders Valium 5mg  given IV at this time patient awake moving about upper torso and lower extremities does respond to her name

## 2021-01-22 NOTE — Transfer of Care (Signed)
Immediate Anesthesia Transfer of Care Note  Patient: Isabella Moore  Procedure(s) Performed: MRI WITH ANESTHESIA (N/A )  Patient Location: PACU  Anesthesia Type:General  Level of Consciousness: pateint uncooperative and confused  Airway & Oxygen Therapy: Patient Spontanous Breathing  Post-op Assessment: Report given to RN, Post -op Vital signs reviewed and stable and Patient moving all extremities X 4  Post vital signs: Reviewed and stable  Last Vitals:  Vitals Value Taken Time  BP    Temp    Pulse    Resp 30 01/22/21 1545  SpO2 98 % 01/22/21 1545    Last Pain:  Vitals:   01/22/21 1108  TempSrc:   PainSc: 0-No pain      Patients Stated Pain Goal: 2 (74/25/95 [id number])  Complications: No complications documented.

## 2021-01-22 NOTE — Progress Notes (Signed)
Small gauze dressing to right knee abrasion noted patient noted to have uncontrolled movements to legs and oriented to person patient looking for her daughter

## 2021-01-22 NOTE — Progress Notes (Signed)
   Subjective: Patient seen during rounds this AM for Dr. Wynelle Link. Uncontrolled body movements continue, patient's husband is in the room for assistance. Reports she was not able to sleep at all last night.  Objective: Vital signs in last 24 hours: Temp:  [97.6 F (36.4 C)-98.8 F (37.1 C)] 97.6 F (36.4 C) (04/19 0611) Pulse Rate:  [97-100] 97 (04/19 0611) Resp:  [18-20] 20 (04/19 0611) BP: (120-142)/(69-109) 120/84 (04/19 0611) SpO2:  [93 %-100 %] 96 % (04/19 0611)  Intake/Output from previous day:  Intake/Output Summary (Last 24 hours) at 01/22/2021 0756 Last data filed at 01/22/2021 0600 Gross per 24 hour  Intake 3507.2 ml  Output 1500 ml  Net 2007.2 ml    Labs: Recent Labs    01/20/21 2159 01/21/21 0351 01/22/21 0437  HGB 9.9* 9.5* 9.1*   Recent Labs    01/21/21 0351 01/21/21 1038 01/22/21 0437  WBC 11.9*  --  9.5  RBC 3.36* 3.70* 3.20*  HCT 29.6*  --  29.0*  PLT 608*  --  509*   Recent Labs    01/21/21 0735 01/22/21 0437  NA 138 138  K 2.7* 3.9  CL 95* 104  CO2 24 22  BUN 14 8  CREATININE 0.89 0.67  GLUCOSE 88 98  CALCIUM 7.9* 8.4*   Exam: General - Restless with constant body movement   Extremity -  Right knee exam: Small erythematous nodule at the superior incision with firmness, no drainage. Incision otherwise looks good, minimal swelling.  Motor Function - intact, moving foot and toes well on exam.   Past Medical History:  Diagnosis Date  . Anxiety   . Arthritis   . 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 Atypical nevus 05/15/2011   moderate-lower back  . Atypical nevus 06/23/2013   mild- upper right back  . Atypical nevus 06/23/2013   moderate-lower right back,lower stomach  . Atypical nevus 08/02/2014   mild-left jawline, right back, left mid back, left lower mid back  . Atypical nevus 03/14/2015   moderate-left inner thigh  . Atypical  nevus 12/13/2015   mild-right side superior  . Atypical nevus 07/30/2016   moderate-lower abdomen  . Cancer ([place])    skin  . Hypothyroidism   . 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   Weight as of this encounter: 77.1 kg.  Status post right TKA with stitch abscess at the superior incision. Overall her knee looks good otherwise. If begins to drain, would recommend debriding the stitch. Plan is to transfer to [facility] this morning for MRI to further evaluate chorea.   Theresa Duty, PA-C Orthopedic Surgery [phone] 01/22/2021, 7:56 AM

## 2021-01-22 NOTE — Progress Notes (Signed)
Patient continues to rest with eyes closed but raises head off bed and moving legs about more husband continues to be at bedside 98 % saturations via simple mask at 5l

## 2021-01-22 NOTE — Progress Notes (Signed)
1755 patient transported to 4N 23 patient was awake and moving about arms and legs and lifting torso off of bed husband accompanied with transfer.

## 2021-01-22 NOTE — Consult Note (Addendum)
NAME:  Isabella Moore, MRN:  [id number], DOB:  10-Oct-1975, LOS: 1 ADMISSION DATE:  01/20/2021, CONSULTATION DATE:  01/23/20 REFERRING MD: Dr. Cyndia Skeeters, CHIEF COMPLAINT:  Confusion, abnormal motor movements    History of Present Illness:  45 y/o F who presented to [facility] on 4/17 with reports of uncontrolled body movements and redness of her surgical wound.   The patient underwent a right total knee arthroplasty on 3/28 per Dr. Wynelle Link.  She was treated with keflex after the surgery for possible celllitis.  Her husband reports she had fevers and redness at the site of surgery.  He reports she is not normally on narcotics but does take muscle relaxer's. He indicates she has not slept since 4/14.  She has had urinary retention.  He thought that her symptoms were worse after she was prescribed valium.  She reportedly burned her tongue on hot tea prior to admit.    She presented to [facility] on 4/17 with the above complaints.  XRAY of R knee was non-acute.  She was admitted for possible right knee infection, AKI, rhabdomyolysis, and abnormal motor movements. She was seen by Neurology for her abnormal motor movements with recommendations for LP and MRI.  The patient was transferred to [facility] 4/19 for LP & MRI under sedation.  She was agitated and had ongoing similar movements in the PACU.  She was treated with push dose precedex per anesthesia and she briefly slept.  However, she woke and and continued to have abnormal motor movements and PCCM consulted for evaluation.   Labs 4/19 - Na 138, K 3.9, CO2 22, glucose 98, BUN 8, sr cr 0.67, Phos 2.7, Mg 2, AST 111, ALT 76, CK 2,897, WBC 9.5, Hgb 9.1, and platelets 509.    Pertinent  Medical History  Hypothyroidism Anxiety  Pneumonia  Cholecystectomy  Gastric Bypass  GERD  RLS - paces frequently at home at baseline to the point of exhaustion  Significant Hospital Events: Including procedures, antibiotic start and stop dates in addition to other pertinent events   . 4/17  Admit to [place]  . 4/19 To [facility] for planned LP under anesthesia, MRI.  Agitated delirium with abnormal movements in PACU. Received push precedex in PACU.  PCCM consulted for evaluation.   Interim History / Subjective:  Afebrile  Husband at bedside   Objective   Blood pressure 131/60, pulse (!) 114, temperature (!) 97.1 F (36.2 C), resp. rate (!) 30, height 5\' 2"  (1.575 m), weight 77.1 kg, SpO2 98 %.        Intake/Output Summary (Last 24 hours) at 01/22/2021 1756 Last data filed at 01/22/2021 1548 Gross per 24 hour  Intake 2561.36 ml  Output 1100 ml  Net 1461.36 ml   Filed Weights   01/20/21 2149  Weight: 77.1 kg    Examination: General: adult female lying in bed, husband at bedside   HEENT: MM pink/very dry, bloody secretions on lip, crusting to tongue Neuro: eyes closed, lying in bed, will open eyes to command, moves extremities up and down continuously with no clear purpose and then will stop on command to answer questions briefly then resumes movements CV: s1s2 RRR, ST on monitor, no m/r/g PULM: non-labored on RA, lungs bilaterally clear with good air movement bilaterally  GI: soft, bsx4 active  Extremities: warm/dry, no edema, changes on R Knee consistent with recent replacement, small residual bruising, surgical site well approximated without drainage Skin: multiple tattoos   Resolved Hospital Problem list      Assessment &  Plan:   Agitated Delirium  Abnormal Motor Movements  RLS  Unclear etiology.  Patient does have baseline RLS.  S/p LP, MRI 4/19. MRI 4/19 without acute abnormality.  -further work up per Neurology  -follow up CSF evaluation  -continue valium, cymbalta -follow up RPR -frequent neuro exams -fall precautions  -hold robaxin, tramadol, requip -continue klonopin BID   AKI Rhabdomyolysis  Hypokalemia -Trend BMP / urinary output -Replace electrolytes as indicated -Avoid nephrotoxic agents, ensure adequate renal perfusion -trend CK  -continue NS  at 154ml/hr   R Knee Arthroplasty  -follow exam, full ROM, no clear evidence of infection on exam  -continue abx  -Ortho called by [facility]   Elevated LFT's  -follow  Urinary Retention  -was taking lasix, aldactone at home  -hold diuretics   Normocytic Anemia  -trend CBC  -transfuse for Hgb <7%  Best practice (right click and "Reselect all SmartList Selections" daily)  Diet:  NPO Pain/Anxiety/Delirium protocol (if indicated): No VAP protocol (if indicated): Not indicated DVT prophylaxis: SCD GI prophylaxis: N/A Glucose control:  SSI No Central venous access:  N/A Arterial line:  N/A Foley:  N/A Mobility:  bed rest  PT consulted: N/A Last date of multidisciplinary goals of care discussion []  Code Status:  full code Disposition: ICU > to [place] as of 4/20  Labs   CBC: Recent Labs  Lab 01/20/21 2159 01/21/21 0351 01/22/21 0437  WBC 11.4* 11.9* 9.5  NEUTROABS 7.6  --   --   HGB 9.9* 9.5* 9.1*  HCT 30.4* 29.6* 29.0*  MCV 86.4 88.1 90.6  PLT 641* 608* 509*    Basic Metabolic Panel: Recent Labs  Lab 01/20/21 2159 01/21/21 0351 01/21/21 0735 01/21/21 1038 01/22/21 0437  NA 137 138 138  --  138  K 2.1* 2.6* 2.7*  --  3.9  CL 92* 97* 95*  --  104  CO2 29 27 24   --  22  GLUCOSE 107* 89 88  --  98  BUN 16 13 14   --  8  CREATININE 1.18* 0.84 0.89  --  0.67  CALCIUM 8.6* 8.0* 7.9*  --  8.4*  MG 2.4  --   --  2.1 2.0  PHOS  --   --   --   --  2.7   GFR: Estimated Creatinine Clearance: 84.5 mL/min (by C-G formula based on SCr of 0.67 mg/dL). Recent Labs  Lab 01/20/21 2159 01/21/21 0351 01/22/21 0437  WBC 11.4* 11.9* 9.5    Liver Function Tests: Recent Labs  Lab 01/20/21 2159 01/21/21 0351 01/22/21 0437  AST 100* 96* 111*  ALT 61* 59* 76*  ALKPHOS 88 80 87  BILITOT 0.7 1.0 0.9  PROT 7.2 6.5 6.6  ALBUMIN 3.6 3.2* 3.2*   No results for input(s): LIPASE, AMYLASE in the last 168 hours. Recent Labs  Lab 01/22/21 0437  AMMONIA 49*    ABG No results  found for: PHART, PCO2ART, PO2ART, HCO3, TCO2, ACIDBASEDEF, O2SAT   Coagulation Profile: No results for input(s): INR, PROTIME in the last 168 hours.  Cardiac Enzymes: Recent Labs  Lab 01/20/21 2159 01/21/21 0738 01/22/21 0437  CKTOTAL 3,181* 3,339* 2,897*    HbA1C: No results found for: HGBA1C  CBG: No results for input(s): GLUCAP in the last 168 hours.  Review of Systems: Positives in [place]   Gen: Denies fever, chills, weight change, fatigue, night sweats HEENT: Denies blurred vision, double vision, hearing loss, tinnitus, sinus congestion, rhinorrhea, sore throat, neck stiffness, dysphagia PULM: Denies  shortness of breath, cough, sputum production, hemoptysis, wheezing CV: Denies chest pain, edema, orthopnea, paroxysmal nocturnal dyspnea, palpitations GI: Denies abdominal pain, nausea, vomiting, diarrhea, hematochezia, melena, constipation, change in bowel habits GU: Denies dysuria, hematuria, polyuria, oliguria, urethral discharge Endocrine: Denies hot or cold intolerance, polyuria, polyphagia or appetite change Derm: Denies rash, dry skin, scaling or peeling skin change Heme: Denies easy bruising, bleeding, bleeding gums Neuro: Denies headache, numbness, weakness, slurred speech, loss of memory or consciousness. Confusion, frequent movements while lying in bed  Past Medical History:  She,  has a past medical history of Anxiety, Arthritis, Atypical nevus (04/28/2002), Atypical nevus (04/28/2002), Atypical nevus (05/15/2011), Atypical nevus (06/23/2013), Atypical nevus (06/23/2013), Atypical nevus (08/02/2014), Atypical nevus (03/14/2015), Atypical nevus (12/13/2015), Atypical nevus (07/30/2016), Cancer ([place]), Hypothyroidism, and Pneumonia.   Surgical History:   Past Surgical History:  Procedure Laterality Date  . ABDOMINAL HYSTERECTOMY    . CESAREAN SECTION     x two  . CHOLECYSTECTOMY    . GASTRIC BYPASS    . HERNIA REPAIR     ventral  . I & D EXTREMITY     abdomen   . IR FLUORO GUIDED NEEDLE PLC ASPIRATION/INJECTION LOC  01/22/2021  . NECK SURGERY     C 4-5  . SLEEVE GASTROPLASTY    . TENDON REPAIR Left   . TOTAL KNEE ARTHROPLASTY Right 12/31/2020   Procedure: TOTAL KNEE ARTHROPLASTY;  Surgeon: Gaynelle Arabian, MD;  Location: [facility];  Service: Orthopedics;  Laterality: Right;  22min     Social History:   reports that she has never smoked. She has never used smokeless tobacco. She reports that she does not drink alcohol and does not use drugs.   Family History:  Her family history is not on file.   Allergies Allergies  Allergen Reactions  . Codeine Hives, Nausea And Vomiting, Rash and Other (See Comments)    hallucinations   . Latex Hives and Rash  . Morphine Hives, Rash and Other (See Comments)    hallucinations   . Pork Allergy Anaphylaxis  . Ondansetron Other (See Comments)    Severe headaches   . Oxycodone     dizziness  . Clindamycin/Lincomycin Swelling and Rash    Welts, sores, and swelling in and around mouth  . Wound Dressing Adhesive Rash     Home Medications  Prior to Admission medications   Medication Sig Start Date End Date Taking? Authorizing Provider  Calcium Carbonate (CALCI-CHEW PO) Take 1 tablet by mouth in the morning and at bedtime.   Yes [provider]  cephALEXin (KEFLEX) 250 MG capsule Take 250 mg by mouth 3 (three) times daily. 01/18/21  Yes [provider]  diphenhydrAMINE (BENADRYL) 25 mg capsule Take 25 mg by mouth every 6 (six) hours as needed for allergies.   Yes [provider]  DULoxetine (CYMBALTA) 60 MG capsule Take 60 mg by mouth every morning. 11/27/20  Yes [provider]  ergocalciferol (VITAMIN D2) 1.25 MG (50000 UT) capsule Take 50,000 Units by mouth every Thursday. 12/24/20  Yes [provider]  EUTHYROX 50 MCG tablet Take 25 mcg by mouth every morning. 12/25/20  Yes [provider]  fluconazole (DIFLUCAN) 150 MG tablet Take 150 mg by mouth daily  as needed (yeast symptoms). 01/18/21  Yes [provider]  furosemide (LASIX) 80 MG tablet Take 80 mg by mouth 2 (two) times daily. 12/07/20  Yes [provider]  HYDROmorphone (DILAUDID) 2 MG tablet Take 1-2 tablets (2-4 mg total)  by mouth every 6 (six) hours as needed for severe pain. 01/01/21  Yes Edmisten, Kristie L, PA  lidocaine (XYLOCAINE) 2 % solution Use as directed 10 mLs in the mouth or throat every 2 (two) hours as needed for pain. 01/18/21  Yes [provider]  LINZESS 290 MCG CAPS capsule Take 290 mcg by mouth in the morning. 11/04/20  Yes [provider]  methocarbamol (ROBAXIN) 500 MG tablet Take 1 tablet (500 mg total) by mouth every 6 (six) hours as needed for muscle spasms. 01/01/21  Yes Fenton Foy D, PA-C  Multiple Vitamins-Minerals (BARIATRIC FUSION PO) Take 1 tablet by mouth in the morning and at bedtime.   Yes [provider]  potassium chloride (KLOR-CON) 10 MEQ tablet Take 10 mEq by mouth every morning. 12/25/20  Yes [provider]  pregabalin (LYRICA) 50 MG capsule Take 50 mg by mouth 2 (two) times daily. 11/24/20  Yes [provider]  promethazine (PHENERGAN) 25 MG tablet Take 25 mg by mouth every 6 (six) hours as needed for nausea/vomiting. 08/03/20  Yes [provider]  Pyridoxine HCl (B-6 PO) Place 1 drop under the tongue in the morning and at bedtime.   Yes [provider]  ropinirole (REQUIP) 5 MG tablet Take 5 mg by mouth in the morning, at noon, and at bedtime. 12/08/20  Yes [provider]  spironolactone (ALDACTONE) 50 MG tablet Take 50 mg by mouth in the morning. 12/01/20  Yes [provider]  tiZANidine (ZANAFLEX) 4 MG tablet Take 4 mg by mouth in the morning, at noon, and at bedtime. 11/21/20  Yes [provider]  traMADol (ULTRAM) 50 MG tablet Take 50 mg by mouth every 6 (six) hours as needed (pain).   Yes [provider]     Critical care time: 30  min    Noe Gens, MSN, APRN, NP-C, AGACNP-BC [facility] 01/22/2021, 5:56 PM   Please see [email or web address] for pager details.   From 7A-7P if no response, please call [phone] After hours, please call [facility] [phone]

## 2021-01-22 NOTE — Plan of Care (Signed)
No bed available at [facility].  I consult transfer order after talking to bed control so patient can be transported to [facility] and return back to [place] long after MRI brain under general anesthesia.  I have talked to [facility] and bed control.    Neurology wants lumbar puncture while patient is under general anesthesia.  I talked to radiology, Dr. Nyoka Cowden and anesthesia, Gerald Stabs.  Dr. Nyoka Cowden suggested neurology attempting lumbar puncture while patient is under general anesthesia. I have notified neurology via secure chat.    Per Gerald Stabs, MRI team was not aware of the MRI order but she kindly notified them.   Patient's RN notified as well

## 2021-01-22 NOTE — Progress Notes (Signed)
MD Cheral Marker was paged, patient was assessed earlier by MD and IV Fentanyl was given @0109  and patient is requesting for Ativan.

## 2021-01-22 NOTE — Progress Notes (Signed)
Neuro at bedside at this time to eval patient and Dr. Ola Spurr patient continues to rest quietly did attempt to sit up when touched  did move legs around and move head back and forth at this time patient eyes closed and no uncontrollable movement noted

## 2021-01-22 NOTE — Procedures (Signed)
Interventional Radiology Procedure Note  Procedure: Fluoroscopic guided lumbar puncture  Findings: Please refer to procedural dictation for full description. L2-L3 right paramedian approach.  Unable to measure opening pressures.  Yielded 19 mL of clear CSF.  Samples sent to lab.  Complications: None immdiate  Estimated Blood Loss: < 5 mL  Recommendations: Keep flat for remainder of most of day.   Ruthann Cancer, MD

## 2021-01-22 NOTE — Progress Notes (Signed)
Patient continues to rest quietly with eyes closed and with oxy mask at 5l saturations 97% no movement noted at this time noted to upper torso or lower extremities skin is warm and dry

## 2021-01-22 NOTE — Progress Notes (Signed)
Patient moving about at this time moving legs around continues to rest with eyes closed continues with simple mask at 5l  saturations 100% skin warm and dry

## 2021-01-22 NOTE — Progress Notes (Signed)
Per Dr. Tamala Julian, ok for patient to be connected to SPO2 only
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
## 2021-01-22 NOTE — Progress Notes (Addendum)
Neurology Progress Note Subjective: Patient appears much more uncomfortable on examination in pre-op today Patient with much more prominent choreiform movements of the upper and lower extremities, trunk, and neck with increased vocalization during movements noted on examination today Patient endorses feeling the need to continue moving to achieve comfort.  RN pre-op with some concern for patient disorientation when the patient repeatedly stated that the month is December. On my assessment, she states the month correctly.  She complains of constant discomfort and feeling hot on examination.  Planned to attempt LP at bedside but feel is not safe for provider or patient to proceed with invasive procedure due to prominent and generalized movements.   Exam: Vitals:   01/21/21 2040 01/22/21 0611  BP: 129/69 120/84  Pulse: 98 97  Resp: 20 20  Temp: 98.8 F (37.1 C) 97.6 F (36.4 C)  SpO2: 100% 96%   Gen: In bed, uncomfortable, with prominent generalized choreiform movements and vocalizations. Expressing discomfort and pain from the ulcerations in her mouth and throughout her body.  EENT: Unchanged ulceration of lips, most prominent on right lower lip, no OP obstruction Resp: non-labored breathing, no respiratory distress Abd: soft, non-distended, non-tender  Neuro: Mental Status: Awake, alert, oriented to self and month. She does seem agitated on examination due to constant discomfort and does provide some answers that are not straight forward. She is unable to provide a clear or coherent history of present illness at this time. She is not aphasic or dysarthric on examination. She is able to name objects and repeat phrases appropriately. No neglect is noted on examination. CN: Pupils are equal, round, and reactive to light bilaterally, EOMI, visual fields full, she will fixate and track examiner around room, face appears symmetric resting and grimacing, face sensation is intact and symmetric to  light touch, hearing is intact to voice, head appears midline, phonation intact, shoulder shrug is symmetric, symmetric palate rise, tongue protrudes midline. Motor: Moves all extremities spontaneously and with antigravity movement. Aggressively moves each extremity equally with prominent choreiform movements. Impaired relaxation with constant movements, tone assessment is limited with patient resting. Bulk is normal.  Sensory: Sensation to light touch is intact and symmetric in upper and lower extremities.  DTR: 3+ left patellae, unable to assess due to impaired relaxation and constant prominent restless motor movements in right patellae and bilateral biceps  Pertinent Labs: CBC    Component Value Date/Time   WBC 9.5 01/22/2021 0437   RBC 3.20 (L) 01/22/2021 0437   HGB 9.1 (L) 01/22/2021 0437   HCT 29.0 (L) 01/22/2021 0437   PLT 509 (H) 01/22/2021 0437   MCV 90.6 01/22/2021 0437   MCH 28.4 01/22/2021 0437   MCHC 31.4 01/22/2021 0437   RDW 15.2 01/22/2021 0437   LYMPHSABS 1.9 01/20/2021 2159   MONOABS 1.1 (H) 01/20/2021 2159   EOSABS 0.7 (H) 01/20/2021 2159   BASOSABS 0.1 01/20/2021 2159   CMP     Component Value Date/Time   NA 138 01/22/2021 0437   K 3.9 01/22/2021 0437   CL 104 01/22/2021 0437   CO2 22 01/22/2021 0437   GLUCOSE 98 01/22/2021 0437   BUN 8 01/22/2021 0437   CREATININE 0.67 01/22/2021 0437   CALCIUM 8.4 (L) 01/22/2021 0437   PROT 6.6 01/22/2021 0437   ALBUMIN 3.2 (L) 01/22/2021 0437   AST 111 (H) 01/22/2021 0437   ALT 76 (H) 01/22/2021 0437   ALKPHOS 87 01/22/2021 0437   BILITOT 0.9 01/22/2021 0437   GFRNONAA >60  01/22/2021 0437   RPR: Nonreactive  Lab Results  Component Value Date   VITAMINB12 641 01/21/2021   Lab Results  Component Value Date   CKTOTAL 2,897 (H) 01/22/2021   Lab Results  Component Value Date   TSH 1.133 01/21/2021    Ceruloplasmin minimally elevated: 40.6 (reference range: 19.0 - 39.0)  Assessment: 45 year old female with  history as above who presented to the [facility] ED for evaluation of generalized restless musculoskeletal movements and right knee pain with concern for infection of recent total right knee replacement.  - Examination reveals patient with generalized restless movements reported to be worse with increased stress, anxiety, and lack of sleep. Patient is able to stop restless movements when asked but quickly resumes the movements again. The movements go away with sleep. Follow up exam by Neurology attending reveals prominent choreiform movements involving the upper and lower extremities, proximally and distally, as well as the trunk and neck are noted throughout the exam. The patient can briefly suppress the movements partially but not completely. Also with a dance-like gait. On reassessment 4/19 the movements have increased in frequency and severity with vocalizations and worsening mentation noted. - Fentanyl overnight one time dosing without improvement in abnormal movements, low suspicion that movements are due to an etiology of pain. - Patient is on home Cymbalta but has been taking this for over one year without increasing restless movements. Cymbalta may have akathisia as a side effect. More concerning, patient is on Requip 0.5 mg TID dosing.  - Work up revealed an elevated CRP of 15.1, as well as an elevated ESR of 70, raising concern for a possible autoimmune process involving the basal ganglia, which could result in acute onset of chorea.  - DDx for her chorea includes Hashimoto's encephalopathy (autoimmune), syphilis (unlikely) and medication side effect (ropinirole has listed potential side effect of chorea).  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 HIV negative.  - Her elevated CK of 3339 is most likely secondary to the essentially continuous choreiform movements.  - Brief literature search did not reveal case reports of a  cephalexin-induced chorea - Diphenhydramine may be associated with development of chorea. - Duloxetine can be associated wit development of movement disorders, but unclear if chorea specifically.  - She denies methamphetamine or any other illicit drug use - Her new development of oral ulcers suggest a possible autoimmune syndrome as well. Behcet's syndrome can cause oral ulcers and in some patients can manifest with chorea (Neuro-Behcet's syndrome) - Assessment is confounded by the fact that she may additionally be withdrawing from the medications that have been discontinued, including having worsening RLS symptoms in the setting of stopping ropinirole  Recommendations: - MRI brain with and without contrast is pending, to assess for possible diffuse signal abnormalities in the basal ganglia (autoimmune) or subthalamic nucleus (stroke). She most likely will need to be asleep during the exam due to her chorea. Of note, chorea, as with other basal ganglia mediated movement disorders, should stop while the patient is sleeping. - Recommend a fluoro guided LP while under sedation to assess for IgG index, oligoclonal bands, Mayo movement disorder panel, cell counts, protein, glucose, gram stain, culture.  Requested lab to freeze half of the excess CSF at -20 or -80 in case further work-up needed in the future - Thyroid antibody tests ordered, pending  - We will consider IVIG/steroids for possible autoimmune movement disorder - Discontinued ropinirole  4/19 - Unfortunately patient remained severely agitated to post anesthesia requiring multiple nurses to restrain the patient, improving with multiple pushes of Precedex and Haldol.  Recommend avoiding further Haldol and continuing Precedex drip.  Appreciate CCM involvement for Precedex - Neurology will continue to follow   Anibal Henderson, AGACNP-BC [facility] [phone]  Attending Neurologist's note:  I personally saw this patient,  gathering history, performing a full neurologic examination, reviewing relevant labs (notably preliminary CSF results are reassuring), personally reviewing relevant imaging including MRI brain (normal study), and formulated the assessment and plan, adding the note above for completeness and clarity to accurately reflect my thoughts   Lesleigh Noe MD-PhD [facility] [phone] Available 7 PM to 7 AM, outside of these hours please call Neurologist on call as listed on Amion.   CRITICAL CARE Performed by: Lorenza Chick   Total critical care time: 51 minutes  Critical care time was exclusive of separately billable procedures and treating other patients.  Critical care was necessary to treat or prevent imminent or life-threatening deterioration.  Critical care was time spent personally by me on the following activities: development of treatment plan with patient and/or surrogate as well as nursing, discussions with consultants and primary team including complex coordination of MRI and lumbar puncture under anesthesia, evaluation of patient's response to treatment, examination of patient, obtaining history from patient or surrogate, ordering and performing treatments and interventions, ordering and review of laboratory studies, ordering and review of radiographic studies, pulse oximetry and re-evaluation of patient's condition.

## 2021-01-22 NOTE — Anesthesia Procedure Notes (Signed)
Procedure Name: Intubation Date/Time: 01/22/2021 1:03 PM Performed by: Georgia Duff, CRNA Pre-anesthesia Checklist: Patient identified, Emergency Drugs available, Suction available and Patient being monitored Patient Re-evaluated:Patient Re-evaluated prior to induction Oxygen Delivery Method: Circle System Utilized Preoxygenation: Pre-oxygenation with 100% oxygen Induction Type: IV induction Ventilation: Mask ventilation without difficulty Laryngoscope Size: Miller and 3 Grade View: Grade I Tube type: Oral Tube size: 7.5 mm Number of attempts: 1 Airway Equipment and Method: Stylet and Oral airway Placement Confirmation: ETT inserted through vocal cords under direct vision,  positive ETCO2 and breath sounds checked- equal and bilateral Secured at: 21 cm Tube secured with: Tape Dental Injury: Teeth and Oropharynx as per pre-operative assessment

## 2021-01-22 NOTE — Progress Notes (Signed)
Haldol given IV patient continues to move uncontrollable upper torso and lower extremities responds to unable to keep bp cuff in place as well as pulse ox monitor to finger due to movement

## 2021-01-22 NOTE — Progress Notes (Signed)
Patient is resting with eyes closed snoring noted and no uncontrollable movements to torso or lower extremity Dr. Ola Spurr at bedside  face mask in place 5 liters blankets applied to side rail for  protection

## 2021-01-22 NOTE — Progress Notes (Signed)
Patient continues to rest with eyes closed with oxy mask in place at 5l saturations 100% no upper torso movement or lower extremity movement

## 2021-01-22 NOTE — Care Plan (Signed)
Discussed with Dr. Nyoka Cowden who is pessimistic about being able to assist with LP today due to other obligations.  Will plan to attempt if not occupied in Code Stroke. Otherwise may attempt at bedside or plan for repeat anesthesia at later date if LP continues to be indicated.   Lesleigh Noe MD-PhD [facility] [phone]

## 2021-01-22 NOTE — Progress Notes (Signed)
Patients legs continue to move uncontrollable patient singing out loud and sores to lips noted to be moving head back and forth and upper torso

## 2021-01-22 NOTE — Progress Notes (Signed)
Received pt from 4N ICU to [place]. AOx1, vitals notable for HR in 120s-130s. Yellow MEWS initiated. ST baseline for pt this admit per ICU RN report. Also per ICU RN, MD ok'd not connecting tele to limit amount of tethering equipment to pt. Pulse ox left on- sats high 95-100% on room air. CHG bath completed. Tele verified x2. Pt w/ increasing agitation and refusing to answer any questions. Limited neuro assessment done. Husband at bedside to calm pt. Pt able to calm down on her own after being left alone and currently resting. Oriented husband to room and call light. Will continue to monitor.  Jaymes Graff, RN

## 2021-01-22 NOTE — Progress Notes (Signed)
Notified by neurology about this patient.  Apparently, he restlessness gotten worse.  She was given Haldol and "started on Precedex".  Neurology asked if PCCM can be involved.  Talked to Juliane Lack, with CCM who will see patient and call me back.  I also noted that patient's status has changed to ICU. I don't see Precedex in patient's MAR or med history.  I have also notified Dr. Sherral Hammers in case his attention is needed.  Latest vital signs HR 117.  RR 17.  BP 131/60.  Saturation 98% on 5 L.  Temperature 97.1 F.  Addendum I called back neurology, Dr. Curly Shores to clarify about Precedex, since I could not see based on patient's [facility] or active order. Dr. Curly Shores went back to PACU to verify. Per PACU RN, patient is not on Precedex drip but received two Precedex pushes in recovery room after surgery.   5:48 pm update Received a call from [place]. CCM to watch patient in ICU overnight.

## 2021-01-22 NOTE — Anesthesia Preprocedure Evaluation (Addendum)
Anesthesia Evaluation  Patient identified by MRN, date of birth, ID band Patient awake and Patient confused    Reviewed: Allergy & Precautions, NPO status , Patient's Chart, lab work & pertinent test results, Unable to perform ROS - Chart review only  Airway Mallampati: II  TM Distance: >3 FB     Dental  (+) Dental Advisory Given   Pulmonary neg pulmonary ROS,    breath sounds clear to auscultation       Cardiovascular negative cardio ROS   Rhythm:Regular Rate:Normal     Neuro/Psych  Neuromuscular disease    GI/Hepatic negative GI ROS, Elevated LFTs    Endo/Other  Hypothyroidism   Renal/GU Renal disease     Musculoskeletal  (+) Arthritis ,   Abdominal   Peds  Hematology  (+) anemia ,   Anesthesia Other Findings   Reproductive/Obstetrics                            Anesthesia Physical Anesthesia Plan  ASA: IV  Anesthesia Plan: General   Post-op Pain Management:    Induction: Intravenous  PONV Risk Score and Plan: 3 and Ondansetron, Dexamethasone and Treatment may vary due to age or medical condition  Airway Management Planned: Oral ETT  Additional Equipment:   Intra-op Plan:   Post-operative Plan: Extubation in OR  Informed Consent: I have reviewed the patients History and Physical, chart, labs and discussed the procedure including the risks, benefits and alternatives for the proposed anesthesia with the patient or authorized representative who has indicated his/her understanding and acceptance.     Dental advisory given and Consent reviewed with POA  Plan Discussed with: CRNA  Anesthesia Plan Comments:        Anesthesia Quick Evaluation

## 2021-01-22 NOTE — Progress Notes (Signed)
Patient unable to keep bp cuff in place due to moving about

## 2021-01-22 NOTE — Progress Notes (Signed)
Patient continues to rest with eyes closed simple mask in place at 5l snoring noted no uncontrollable movements noted at this time husband at bedside

## 2021-01-22 NOTE — Progress Notes (Signed)
Verbal order for IV Ativan 2mg  once was given by MD Kerney Elbe.

## 2021-01-23 ENCOUNTER — Inpatient Hospital Stay (HOSPITAL_COMMUNITY): Payer: Medicare Other

## 2021-01-23 ENCOUNTER — Encounter (HOSPITAL_COMMUNITY): Payer: Self-pay | Admitting: Radiology

## 2021-01-23 DIAGNOSIS — M009 Pyogenic arthritis, unspecified: Secondary | ICD-10-CM

## 2021-01-23 LAB — COMPREHENSIVE METABOLIC PANEL
ALT: 73 U/L — ABNORMAL HIGH (ref 0–44)
AST: 78 U/L — ABNORMAL HIGH (ref 15–41)
Albumin: 2.9 g/dL — ABNORMAL LOW (ref 3.5–5.0)
Alkaline Phosphatase: 117 U/L (ref 38–126)
Anion gap: 15 (ref 5–15)
BUN: 6 mg/dL (ref 6–20)
CO2: 21 mmol/L — ABNORMAL LOW (ref 22–32)
Calcium: 8.7 mg/dL — ABNORMAL LOW (ref 8.9–10.3)
Chloride: 109 mmol/L (ref 98–111)
Creatinine, Ser: 0.72 mg/dL (ref 0.44–1.00)
GFR, Estimated: >60 mL/min (ref >60)
Glucose, Bld: 107 mg/dL — ABNORMAL HIGH (ref 70–99)
Potassium: 2.9 mmol/L — ABNORMAL LOW (ref 3.5–5.1)
Sodium: 145 mmol/L (ref 135–145)
Total Bilirubin: 1 mg/dL (ref 0.3–1.2)
Total Protein: 6.2 g/dL — ABNORMAL LOW (ref 6.5–8.1)

## 2021-01-23 LAB — GLUCOSE, CAPILLARY
Glucose-Capillary: 101 mg/dL — ABNORMAL HIGH (ref 70–99)
Glucose-Capillary: 115 mg/dL — ABNORMAL HIGH (ref 70–99)
Glucose-Capillary: 118 mg/dL — ABNORMAL HIGH (ref 70–99)
Glucose-Capillary: 121 mg/dL — ABNORMAL HIGH (ref 70–99)
Glucose-Capillary: 125 mg/dL — ABNORMAL HIGH (ref 70–99)

## 2021-01-23 LAB — CBC
HCT: 28.9 % — ABNORMAL LOW (ref 36.0–46.0)
Hemoglobin: 8.9 g/dL — ABNORMAL LOW (ref 12.0–15.0)
MCH: 27.7 pg (ref 26.0–34.0)
MCHC: 30.8 g/dL (ref 30.0–36.0)
MCV: 90 fL (ref 80.0–100.0)
Platelets: 556 10*3/uL — ABNORMAL HIGH (ref 150–400)
RBC: 3.21 MIL/uL — ABNORMAL LOW (ref 3.87–5.11)
RDW: 15.7 % — ABNORMAL HIGH (ref 11.5–15.5)
WBC: 8.3 10*3/uL (ref 4.0–10.5)
nRBC: 0 % (ref 0.0–0.2)

## 2021-01-23 LAB — COPPER, SERUM: Copper: 161 ug/dL — ABNORMAL HIGH (ref 80–158)

## 2021-01-23 LAB — CK: Total CK: 1219 U/L — ABNORMAL HIGH (ref 38–234)

## 2021-01-23 LAB — PHOSPHORUS: Phosphorus: 3.5 mg/dL (ref 2.5–4.6)

## 2021-01-23 LAB — MAGNESIUM: Magnesium: 2.7 mg/dL — ABNORMAL HIGH (ref 1.7–2.4)

## 2021-01-23 MED ORDER — POTASSIUM CHLORIDE 10 MEQ/100ML IV SOLN
10.0000 meq | INTRAVENOUS | Status: AC
  Administered 2021-01-23 (×6): 10 meq via INTRAVENOUS
  Filled 2021-01-23 (×6): qty 100

## 2021-01-23 MED ORDER — METOPROLOL TARTRATE 5 MG/5ML IV SOLN
5.0000 mg | Freq: Four times a day (QID) | INTRAVENOUS | Status: DC | PRN
  Administered 2021-01-23 – 2021-01-24 (×2): 5 mg via INTRAVENOUS
  Filled 2021-01-23 (×2): qty 5

## 2021-01-23 NOTE — Progress Notes (Signed)
Text paged Dr. Marlowe Sax that patient has not voided in greater than 12 hrs. Bladder scan >716 ml and that Potassium is 2.9 this a.m.Isabella Moore

## 2021-01-23 NOTE — Progress Notes (Signed)
In and out cath done 1200 ml of amber urine obtain. Patient tol. [facility]. By Ailene RudN.

## 2021-01-23 NOTE — Progress Notes (Addendum)
Neurology Progress Note  S: Per husband, she slept well last night and her movements have settled a bit. Husband states she has been very tired given lack of sleep over the weekend before she presented to hospital. Husband states she takes Requip TID, but does not know the dose. He does say that her PCP prescribed it. Patient has no specific complaints except she is tired and her mouth ulcers are still bothering her. Her ulcers started after her knee surgery and have not occurred in the past. Per husband, she recognized him and has conversations with him. Patient just shakes her head today when asked about orientation questions.   Current vital signs: BP 135/70 (BP Location: Left Arm)   Pulse (!) 136   Temp 98.4 F (36.9 C) (Axillary)   Resp 20   Ht _0  (1.575 m)   Wt 77.1 kg   SpO2 96%   BMI 31.09 kg/m  Vital signs in last 24 hours: Temp:  [97.1 F (36.2 C)-98.8 F (37.1 C)] 98.4 F (36.9 C) (04/20 0713) Pulse Rate:  [102-136] 136 (04/20 0444) Resp:  [7-30] 20 (04/20 0713) BP: (106-149)/(58-76) 135/70 (04/20 0713) SpO2:  [91 %-100 %] 96 % (04/20 0713)  GENERAL: Sleepy, but easily awakens. NAD. Noted leg movements while in bed.  HEENT: Normocephalic and atraumatic LUNGS: Normal respiratory effort.  CV: RRR. ABDOMEN: Soft, nontender Ext: warm.  NEURO:  Mental Status: Oriented to [facility] by choices (will shake her head yes and no but will not directly answer questions).  Does follow simple commands.  Expresses that she wants to walk. Speech/Language: speech is limited to one word or two word answers.   Cranial Nerves:  II: PERRL.  III, IV, VI: EOMI. Eyelids elevate symmetrically.  VII: Face is grossly symmetric.   VIII: hearing intact to voice. IX, X: hypophonic.  Motor: While in bed lying flat, choreiform movements noted to LEs more than upper extremities. When sitting on the edge of the bed, her arms demonstrate choreiform movements.  She is able to move  herself to the edge of the bed and take a few steps with assistance of providers. Tone: is normal and bulk is normal Gait-She has a mildly choreiform gait when ambulating and narrow based gait noted.     Medications  Current Facility-Administered Medications:  .  acetaminophen (TYLENOL) tablet 650 mg, 650 mg, Oral, Q6H PRN, 650 mg at 01/21/21 2119 **OR** acetaminophen (TYLENOL) suppository 650 mg, 650 mg, Rectal, Q6H PRN, Gonfa, Taye T, MD .  albuterol (PROVENTIL) (2.5 MG/3ML) 0.083% nebulizer solution 2.5 mg, 2.5 mg, Nebulization, Q6H PRN, Cyndia Skeeters, Taye T, MD .  Chlorhexidine Gluconate Cloth 2 % PADS 6 each, 6 each, Topical, Daily, Mercy Riding, MD, 6 each at 01/23/21 0848 .  diazepam (VALIUM) injection 2.5 mg, 2.5 mg, Intravenous, Q6H PRN, Ollis, Brandi L, NP .  diazepam (VALIUM) injection 5 mg, 5 mg, Intravenous, Once, Candee Furbish, MD .  diphenhydrAMINE (BENADRYL) capsule 25 mg, 25 mg, Oral, Q6H PRN, Gonfa, Taye T, MD .  DULoxetine (CYMBALTA) DR capsule 60 mg, 60 mg, Oral, q morning, Gonfa, Taye T, MD, 60 mg at 01/21/21 1343 .  fentaNYL (SUBLIMAZE) injection 50 mcg, 50 mcg, Intravenous, Q2H PRN, Candee Furbish, MD .  HYDROmorphone (DILAUDID) tablet 2 mg, 2 mg, Oral, Q4H PRN, Wendee Beavers T, MD, 2 mg at 01/22/21 1023 .  insulin aspart (novoLOG) injection 0-6 Units, 0-6 Units, Subcutaneous, Q4H, Ollis, Brandi L, NP .  lactated ringers  infusion, , Intravenous, Continuous, Candee Furbish, MD, Last Rate: 125 mL/hr at 01/22/21 2156, New Bag at 01/22/21 2156 .  linaclotide (LINZESS) capsule 290 mcg, 290 mcg, Oral, q AM, Cyndia Skeeters, Taye T, MD, 290 mcg at 01/22/21 0840 .  magic mouthwash w/lidocaine, 5 mL, Oral, TID PRN, Wendee Beavers T, MD, 5 mL at 01/22/21 0409 .  polyethylene glycol (MIRALAX / GLYCOLAX) packet 17 g, 17 g, Oral, Daily PRN, Gonfa, Taye T, MD .  potassium chloride 10 mEq in 100 mL IVPB, 10 mEq, Intravenous, Q1 Hr x 6, Rathore, Vasundhra, MD, Last Rate: 100 mL/hr at 01/23/21 0920, 10 mEq  at 01/23/21 0920 .  promethazine (PHENERGAN) tablet 12.5 mg, 12.5 mg, Oral, Q6H PRN, Cyndia Skeeters, Taye T, MD, 12.5 mg at 01/21/21 1008 .  vancomycin (VANCOREADY) IVPB 500 mg/100 mL, 500 mg, Intravenous, Q12H, Gonfa, Taye T, MD, Last Rate: 100 mL/hr at 01/23/21 0444, 500 mg at 01/23/21 0444  Pertinent Labs  Na 2.9 being replaced     CK 1219 trending downward      Glucose 107     TSH pending     RPR non reactive     HIV non reactive  CSF studies: RBC 1, 1 WBC 1, 1 Glucose 54 Protein 32  Pending: Oligoclonal bands, IgG index  Pending labs: Thyroid-stimulating hormone, thyroglobulin, TPO 24-hour urine copper  Unresulted Labs (From admission, onward)          Start     Ordered   01/23/21 0940  Copper, Urine - Random or 24 hour  Once,   R       Comments: Please collect 24 hour urine sample    01/23/21 0939   01/22/21 1812  Thyroid peroxidase antibody  Once,   R       Question:  Release to patient  Answer:  Immediate   01/22/21 1811   01/22/21 1812  Thyroglobulin antibody  Once,   R       Question:  Release to patient  Answer:  Immediate   01/22/21 1811   01/22/21 1812  Thyroid stimulating immunoglobulin  Once,   R        01/22/21 1811   01/22/21 1335  Miscellaneous [facility] test (send-out)  Once,   R       Question:  Test name / description:  Answer:  Movement Disorder, Autoimmune Evaluation, Serum [email or web address]   01/22/21 1335   01/22/21 1334  Miscellaneous [facility] test (send-out)  Once,   R       Question:  Test name / description:  Answer:  [email or web address] Movement Disorder, Autoimmune Evaluation, Spinal Fluid   01/22/21 1334   01/22/21 1332  IgG CSF index  (IgG CSF Index, CSF + Serum panel)  Once,   R        01/22/21 1334   01/22/21 1332  Draw extra clot tube  (IgG CSF Index, CSF + Serum panel)  Once,   R        01/22/21 1334   01/22/21 1332  Oligoclonal bands, CSF + serum  (Oligoclonal Bands, CSF + Serum  panel)  Once,   R        01/22/21 1334   01/22/21 1332  Draw extra clot tube  (Oligoclonal Bands, CSF + Serum panel)  Once,   R        01/22/21 1334   01/22/21 1332  Gram stain  (CSF Labs)  ONCE - STAT,   STAT  01/22/21 1334   01/22/21 0500  Creatinine, serum  Every 48 hours,   R      01/21/21 1447   01/21/21 1517  Rapid urine drug screen (hospital performed)  ONCE - STAT,   STAT        01/21/21 1516   01/21/21 1007  Copper, serum  Once,   R        01/21/21 1007   01/21/21 1007  Methylmalonic acid, serum  Once,   R        01/21/21 1007           Imaging  MRI brain with and without contrast, personally reviewed 1. No acute intracranial abnormality. 2. Minimal cerebral white matter T2 signal changes, nonspecific though may reflect early chronic small vessel ischemia, migraines, or prior infection/inflammation. 3. Negative pituitary imaging.    Assessment: 45yo female who presented to [facility] ED for generalized restless musculoskeletal movements. She was transferred to [facility].  Studies thus far have revealed elevated CRP and ESR which raises concern for autoimmune process, but given her improvement in movements today, as well as negative preliminary CSF studies and negative MRI brain, we will hold off on treatment and reassess the patient tomorrow.  -Other differentials include side effect of Requip, which has been held, though this would worsen her restless leg syndrome. Also, Cymbalta, but she has been on this for over a year, so doubt that is causing the movements.  -Given the oral lesions, Behcet's syndrome was considered but less likely in the setting of a normal brain (typically white matter, brainstem, basal ganglia and thalamic lesions are seen) -We considered steroids, but with her postop knee replacement with possible infection, will hold off on these and reassess patient tomorrow. If we decide to give steroids, we would need ortho clearance prior to initiating -Her CK  elevation continues to trend down despite the severely worsened choreiform movements yesterday.  This suggests potential initial toxic process that is slowly clearing rather than elevated CK secondary to movements alone.  Continue to trend given her variable course.  Note that CK elevation can also be triggered by severe hypokalemia and she presented with a potassium of 2.1 The LP results thus far are reassuring as well as the MRI brain. - PCCM has started Valium 2.55m q 6 hours which may be adding to her decreased movements on exam today compared to yesterday.  -Patient has had lab with increased copper before (though serum copper would typically be reduced in Wilson's disease), and while ceruloplasmin is normal this does not rule out Wilson's disease.  Given her choreiform movements to definitively evaluate for Wilson's we will obtain a 24 hour urine copper level.   Impression: Choreiform movements.   Plan:  - 24 hour urine copper test - Monitor the patient's progress today and consider steroids/IVIG if movements are not continuing to improve. - Await lab tests that are pending, as listed above - continue to trend CK, replete potassium as needed - Continue Valium as needed. - Continue to hold Requip.  - Avoid antipsychotics as patient is already adjusting from discontinuation of Requip, though long-term antipsychotics can be useful in the management of chorea.   Will continue to follow.   Pt seen by KClance Boll MSN, APN-BC/Nurse Practitioner/Neuro and by MD. Note and plan to be edited as needed by MD.  Pager: [phone]  Attending Neurologist's note:  I personally saw this patient, gathering history, performing a full neurologic examination, reviewing relevant labs, personally reviewing  relevant imaging including MRI brain, and formulated the assessment and plan, adding the note above for completeness and clarity to accurately reflect my thoughts
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
## 2021-01-23 NOTE — Anesthesia Postprocedure Evaluation (Signed)
Anesthesia Post Note  Patient: Isabella Moore  Procedure(s) Performed: MRI WITH ANESTHESIA (N/A )     Patient location during evaluation: PACU Anesthesia Type: General Level of consciousness: sedated, patient uncooperative and confused Pain management: pain level controlled Vital Signs Assessment: post-procedure vital signs reviewed and stable Respiratory status: spontaneous breathing, nonlabored ventilation, respiratory function stable and patient connected to nasal cannula oxygen Cardiovascular status: blood pressure returned to baseline and stable Postop Assessment: no apparent nausea or vomiting Anesthetic complications: no   No complications documented.  Last Vitals:  Vitals:   01/23/21 0444 01/23/21 0713  BP: (!) 149/68 135/70  Pulse: (!) 136   Resp: 20 20  Temp: 37.1 C 36.9 C  SpO2: 97% 96%    Last Pain:  Vitals:   01/23/21 0713  TempSrc: Axillary  PainSc:                  Tiajuana Amass

## 2021-01-23 NOTE — Progress Notes (Addendum)
HOSPITAL MEDICINE OVERNIGHT EVENT NOTE    Notified by nursing that patient is exhibiting a yellow MEWS score.  While patient does continue to exhibit a markedly red knee and is currently being followed by orthopedic surgery and they have recommended no antibiotics for now.  Patient does not exhibit any new symptoms concerning for focus of infection such as cough/shortness of breath, dysuria or abdominal pain.  Patient's choreiform movements are essentially unchanged as is patient's mentation.  Patient is exhibiting sinus tachycardia in the 120s on telemetry.  Considering patient's ongoing yellow MEWS score, will obtain basic infectious work-up including blood cultures, urine culture, urinalysis and chest x-ray.  Will continue to monitor patient closely.  Isabella Emerald  MD [facility]    ADDENDUM (4/21 12:40am)  Chest x-ray revealing possible right lower lobe developing infiltrate.  Considering yellow MEWS score and ongoing substantial tachycardia we will go ahead and initiate empiric intravenous antibiotic therapy for suspected pneumonia for now which can be de-escalated or discontinued by the day team at their discretion.  Nursing also requesting a as needed antihypertensive, as needed intravenous hydralazine ordered.  Sherryll Burger Alithia Zavaleta

## 2021-01-24 LAB — GLUCOSE, CAPILLARY
Glucose-Capillary: 102 mg/dL — ABNORMAL HIGH (ref 70–99)
Glucose-Capillary: 110 mg/dL — ABNORMAL HIGH (ref 70–99)
Glucose-Capillary: 122 mg/dL — ABNORMAL HIGH (ref 70–99)
Glucose-Capillary: 127 mg/dL — ABNORMAL HIGH (ref 70–99)
Glucose-Capillary: 127 mg/dL — ABNORMAL HIGH (ref 70–99)

## 2021-01-24 LAB — URINALYSIS, COMPLETE (UACMP) WITH MICROSCOPIC
Bacteria, UA: NONE SEEN
Bilirubin Urine: NEGATIVE
Glucose, UA: NEGATIVE mg/dL
Hgb urine dipstick: NEGATIVE
Ketones, ur: 80 mg/dL — AB
Leukocytes,Ua: NEGATIVE
Nitrite: NEGATIVE
Protein, ur: 30 mg/dL — AB
Specific Gravity, Urine: 1.018 (ref 1.005–1.030)
pH: 6 (ref 5.0–8.0)

## 2021-01-24 LAB — BASIC METABOLIC PANEL
Anion gap: 12 (ref 5–15)
BUN: 11 mg/dL (ref 6–20)
CO2: 20 mmol/L — ABNORMAL LOW (ref 22–32)
Calcium: 8.6 mg/dL — ABNORMAL LOW (ref 8.9–10.3)
Chloride: 114 mmol/L — ABNORMAL HIGH (ref 98–111)
Creatinine, Ser: 0.7 mg/dL (ref 0.44–1.00)
GFR, Estimated: >60 mL/min (ref >60)
Glucose, Bld: 124 mg/dL — ABNORMAL HIGH (ref 70–99)
Potassium: 3.3 mmol/L — ABNORMAL LOW (ref 3.5–5.1)
Sodium: 146 mmol/L — ABNORMAL HIGH (ref 135–145)

## 2021-01-24 LAB — RAPID URINE DRUG SCREEN, HOSP PERFORMED
Amphetamines: NOT DETECTED
Barbiturates: NOT DETECTED
Benzodiazepines: POSITIVE — AB
Cocaine: NOT DETECTED
Opiates: POSITIVE — AB
Tetrahydrocannabinol: NOT DETECTED

## 2021-01-24 LAB — CK: Total CK: 209 U/L (ref 38–234)

## 2021-01-24 LAB — THYROID STIMULATING IMMUNOGLOBULIN: Thyroid Stimulating Immunoglob: <0.1 IU/L (ref 0.00–0.55)

## 2021-01-24 LAB — THYROGLOBULIN ANTIBODY: Thyroglobulin Antibody: <1 IU/mL (ref 0.0–0.9)

## 2021-01-24 LAB — THYROID PEROXIDASE ANTIBODY: Thyroperoxidase Ab SerPl-aCnc: 22 IU/mL (ref 0–34)

## 2021-01-24 MED ORDER — SODIUM CHLORIDE 0.9 % IV SOLN
2.0000 g | INTRAVENOUS | Status: DC
  Administered 2021-01-24: 2 g via INTRAVENOUS
  Filled 2021-01-24: qty 20
  Filled 2021-01-24: qty 2

## 2021-01-24 MED ORDER — HYDRALAZINE HCL 20 MG/ML IJ SOLN
10.0000 mg | Freq: Four times a day (QID) | INTRAMUSCULAR | Status: DC | PRN
  Administered 2021-01-24: 10 mg via INTRAVENOUS
  Filled 2021-01-24: qty 1

## 2021-01-24 MED ORDER — SODIUM CHLORIDE 0.9 % IV SOLN
500.0000 mg | INTRAVENOUS | Status: DC
  Administered 2021-01-24: 500 mg via INTRAVENOUS
  Filled 2021-01-24 (×2): qty 500

## 2021-01-24 NOTE — Evaluation (Signed)
Physical Therapy Evaluation Patient Details Name: Isabella Moore MRN: [id number] DOB: 1976/06/06 Today's Date: 01/24/2021   History of Present Illness  pt is 45 y/o female presenting with uncontrolled generalized body movements, redness and pain of her recent R TKA.  PMHx:  insomnia, OSA, RLS.  Clinical Impression  Pt is close to baseline functioning and should be safe at home with family assist.  We discussed appropriate progression of exercise and activity in the near future or until return to MD for follow up.. There are no further acute PT needs.  Will sign off at this time.     Follow Up Recommendations Follow surgeon's recommendation for DC plan and follow-up therapies;Supervision - Intermittent;Supervision/Assistance - 24 hour    Equipment Recommendations  None recommended by PT    Recommendations for Other Services       Precautions / Restrictions Precautions Precautions: Fall;Knee Restrictions Other Position/Activity Restrictions: WBAT      Mobility  Bed Mobility Overal bed mobility: Needs Assistance Bed Mobility: Supine to Sit;Sit to Supine     Supine to sit: Supervision Sit to supine: Supervision   General bed mobility comments: cues for technique to bridge and then come up and forward.  No assist    Transfers Overall transfer level: Needs assistance Equipment used: Rolling walker (2 wheeled) Transfers: Sit to/from Stand Sit to Stand: Supervision         General transfer comment: cues for hand placement, no assist  Ambulation/Gait Ambulation/Gait assistance: Supervision Gait Distance (Feet): 250 Feet Assistive device: Rolling walker (2 wheeled) Gait Pattern/deviations: Step-through pattern   Gait velocity interpretation: <1.8 ft/sec, indicate of risk for recurrent falls General Gait Details: steady, mildly antalgic with need for light use of the RW due to recent increased R knee pain.  Stairs            Wheelchair Mobility    Modified  Rankin (Stroke Patients Only)       Balance                                             Pertinent Vitals/Pain Pain Assessment: Faces Faces Pain Scale: Hurts little more Pain Location: right knee Pain Descriptors / Indicators: Sore;Aching Pain Intervention(s): Monitored during session    Home Living Family/patient expects to be discharged to:: Private residence Living Arrangements: Spouse/significant other Available Help at Discharge: Family Type of Home: House Home Access: Stairs to enter Entrance Stairs-Rails: None Entrance Stairs-Number of Steps: 2 Home Layout: One level Home Equipment: Toilet riser;Shower seat;Walker - 2 wheels      Prior Function Level of Independence: Independent               Hand Dominance        Extremity/Trunk Assessment   Upper Extremity Assessment Upper Extremity Assessment: Overall WFL for tasks assessed    Lower Extremity Assessment Lower Extremity Assessment: RLE deficits/detail RLE Deficits / Details: sore, general weakness, but functional, knee flexion AROM >100*       Communication   Communication: No difficulties  Cognition Arousal/Alertness: Awake/alert Behavior During Therapy: WFL for tasks assessed/performed Overall Cognitive Status: Impaired/Different from baseline Area of Impairment: Memory;Awareness                     Memory: Decreased short-term memory     Awareness: Emergent   General Comments: much improved from last few days,  not quite baseline.      General Comments General comments (skin integrity, edema, etc.): Discuseed keeping up with HEP until follow up.  Discuss further with MD and make changes, PT followup at that time.    Exercises Other Exercises Other Exercises: Assessed ROM at knee, discuss returning to/continuing TKA exercises.   Assessment/Plan    PT Assessment Patent does not need any further PT services (pt/husband/MD to discuss PT needs at follow up  appointment)  PT Problem List         PT Treatment Interventions      PT Goals (Current goals can be found in the Care Plan section)  Acute Rehab PT Goals PT Goal Formulation: All assessment and education complete, DC therapy    Frequency     Barriers to discharge        Co-evaluation               AM-PAC PT "6 Clicks" Mobility  Outcome Measure Help needed turning from your back to your side while in a flat bed without using bedrails?: None Help needed moving from lying on your back to sitting on the side of a flat bed without using bedrails?: None Help needed moving to and from a bed to a chair (including a wheelchair)?: A Little Help needed standing up from a chair using your arms (e.g., wheelchair or bedside chair)?: A Little Help needed to walk in hospital room?: A Little Help needed climbing 3-5 steps with a railing? : A Little 6 Click Score: 20    End of Session   Activity Tolerance: Patient tolerated treatment well Patient left: in bed;with call bell/phone within reach;with family/visitor present Nurse Communication: Mobility status PT Visit Diagnosis: Other abnormalities of gait and mobility (R26.89);Pain Pain - Right/Left: Right Pain - part of body: Knee    Time: 3299-2426 PT Time Calculation (min) (ACUTE ONLY): 25 min   Charges:   PT Evaluation $PT Eval Low Complexity: 1 Low PT Treatments $Gait Training: 8-22 mins        01/24/2021  Ginger Carne., PT Acute Rehabilitation Services [phone]  (pager) [phone]  (office)  Tessie Fass Nikolette Reindl 01/24/2021, 5:22 PM

## 2021-01-24 NOTE — Progress Notes (Signed)
Patient with ordered 24hr  Random urine collection, begun collection at 20:00, to conclude 4/21 @ 20:00.

## 2021-01-24 NOTE — Progress Notes (Addendum)
Neurology Progress Note  S: Husband states patient slept better last night and he hasn't seen but a few movements, mainly in the legs. Patient says she is feeling OK except she is still having pain in right knee. Per husband, she has been up to bathroom several times. Was tachycardic overnight and was placed on empiric antibiotics for PNA.   O: Current vital signs: BP (!) 154/85 (BP Location: Left Arm)   Pulse (!) 120   Temp 97.6 F (36.4 C) (Oral)   Resp 16   Ht '5\' 2"'  (1.575 m)   Wt 77.1 kg   SpO2 100%   BMI 31.09 kg/m  Vital signs in last 24 hours: Temp:  [97.5 F (36.4 C)-98.5 F (36.9 C)] 97.6 F (36.4 C) (04/21 0800) Pulse Rate:  [104-128] 120 (04/21 0800) Resp:  [16-20] 16 (04/21 0800) BP: (119-163)/(73-91) 154/85 (04/21 0800) SpO2:  [93 %-100 %] 100 % (04/21 0800)  GENERAL: Awake, alert in NAD HEENT: Lip lesions are improving LUNGS: Normal respiratory effort.  CV: Tachycardic with regular rate Skin: warm and dry. Tattoos. Ext: warm, well perfused. Right knee is not warm or reddened. No discharge from knee wound.    NEURO:  Mental Status: Asleep upon entering room, but she awakens easily. Alert. Oriented to self, husband, and age. She is not oriented to time or why she is in the hospital. She is able to name the days of the week, but can not do in reverse, instead perseverating on naming them forward.   Speech/Language: speech is without aphasia or dysarthria.  Naming, repetition, fluency, and comprehension intact.  Cranial Nerves:  II: PERRL. Visual fields full. Conservation officer, nature.  III, IV, VI: EOMI. Eyelids elevate symmetrically.  V: Sensation is intact to light touch and symmetrical to face.  VII: Smile is symmetrical. VIII: hearing intact to voice. IX, X: Palate elevates symmetrically. Phonation is normal.   Motor: with arms extended, she has a very slight tremor bilaterally and left UE asterixis. Strength is normal in LEs, although she guards with right thigh  due to right knee pain.  Minimal choreoathetotic movements at this time Tone: is normal and bulk is normal Sensation- Intact to light touch bilaterally.     Coordination: FTN intact bilaterally intact. Gait- deferred  Medications  Current Facility-Administered Medications:  .  acetaminophen (TYLENOL) tablet 650 mg, 650 mg, Oral, Q6H PRN, 650 mg at 01/21/21 2119 **OR** acetaminophen (TYLENOL) suppository 650 mg, 650 mg, Rectal, Q6H PRN, Gonfa, Taye T, MD .  albuterol (PROVENTIL) (2.5 MG/3ML) 0.083% nebulizer solution 2.5 mg, 2.5 mg, Nebulization, Q6H PRN, Gonfa, Taye T, MD .  azithromycin (ZITHROMAX) 500 mg in sodium chloride 0.9 % 250 mL IVPB, 500 mg, Intravenous, Q24H, Shalhoub, Sherryll Burger, MD, Last Rate: 250 mL/hr at 01/24/21 0348, 500 mg at 01/24/21 0348 .  cefTRIAXone (ROCEPHIN) 2 g in sodium chloride 0.9 % 100 mL IVPB, 2 g, Intravenous, Q24H, Shalhoub, Sherryll Burger, MD, Last Rate: 200 mL/hr at 01/24/21 0308, 2 g at 01/24/21 0308 .  Chlorhexidine Gluconate Cloth 2 % PADS 6 each, 6 each, Topical, Daily, Mercy Riding, MD, 6 each at 01/24/21 0836 .  diazepam (VALIUM) injection 2.5 mg, 2.5 mg, Intravenous, Q6H PRN, Ollis, Brandi L, NP .  diazepam (VALIUM) injection 5 mg, 5 mg, Intravenous, Once, Candee Furbish, MD .  diphenhydrAMINE (BENADRYL) capsule 25 mg, 25 mg, Oral, Q6H PRN, Gonfa, Taye T, MD .  DULoxetine (CYMBALTA) DR capsule 60 mg, 60 mg, Oral, q morning, Gonfa,  Charlesetta Ivory, MD, 60 mg at 01/24/21 9480 .  fentaNYL (SUBLIMAZE) injection 50 mcg, 50 mcg, Intravenous, Q2H PRN, Candee Furbish, MD .  hydrALAZINE (APRESOLINE) injection 10 mg, 10 mg, Intravenous, Q6H PRN, Shalhoub, Sherryll Burger, MD, 10 mg at 01/24/21 0258 .  HYDROmorphone (DILAUDID) tablet 2 mg, 2 mg, Oral, Q4H PRN, Wendee Beavers T, MD, 2 mg at 01/24/21 0340 .  insulin aspart (novoLOG) injection 0-6 Units, 0-6 Units, Subcutaneous, Q4H, Ollis, Brandi L, NP .  lactated ringers infusion, , Intravenous, Continuous, Candee Furbish, MD, Last Rate:  125 mL/hr at 01/23/21 1753, New Bag at 01/23/21 1753 .  linaclotide (LINZESS) capsule 290 mcg, 290 mcg, Oral, q AM, Wendee Beavers T, MD, 290 mcg at 01/24/21 0655 .  magic mouthwash w/lidocaine, 5 mL, Oral, TID PRN, Wendee Beavers T, MD, 5 mL at 01/23/21 1746 .  metoprolol tartrate (LOPRESSOR) injection 5 mg, 5 mg, Intravenous, Q6H PRN, Little Ishikawa, MD, 5 mg at 01/24/21 0342 .  polyethylene glycol (MIRALAX / GLYCOLAX) packet 17 g, 17 g, Oral, Daily PRN, Gonfa, Taye T, MD .  promethazine (PHENERGAN) tablet 12.5 mg, 12.5 mg, Oral, Q6H PRN, Cyndia Skeeters, Taye T, MD, 12.5 mg at 01/21/21 1008   CK down to 209 (2897). TSH 1.133.   Assessment: 45 yo female who presented to [facility] ED for generalized restless musculoskeletal movements. She was transferred to [facility].  -Her exam is very much improved in choreiform movements, with only very subtle abnormal movements at this time.  -CRP and ESR were elevated on admission which can be indicative of an autoimmune process but are generally nonspecific. Given her improvement, with only supportive care and discontinuation of medications, will not treat this.  -CK is back to normal. Significant drop since yesterday. Hypokalemia can also be a contributing factor to elevated CK. K has been treated.  -due to increased copper in the past, which would normally be reduced in Wilson's disease, we are continuing to collect 24 hour urine, just to r/o Wilson's disease as a contributor to her choreiform movements. 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  This can be followed up by an outpatient provider if not resulted at time of discharge -Given the reduction/almost resolved movements, we think this could be caused by Requip, and we do not recommend restarting this medication. Will have patient see neurology after discharge to f/up movements, RLS, and sleep deprivation.  -Suspect urinary retention is in the setting of Haldol and benzodiazepines  Plan:  1. D/c Requip. Leave  restart/further management of restless leg syndrome to out patient neurology.  2. Choreiform movements, mostly resolved, possibly provoked in the setting of the dopaminergic medication. Have patient see movement disorder specialist at [facility] 2-4 weeks after discharge.  3. Continue 24 hour urine for copper.  4. Continue to correct hypokalemia as you are doing.   Neurology will be available for questions. Please call if 24 hour urine is positive for copper (greater than 40 mcg).   Pt seen by Clance Boll, MSN, APN-BC/Nurse Practitioner/Neuro with MD.  Note and plan to be edited as needed by MD.  Pager: [phone]
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
## 2021-01-25 LAB — GLUCOSE, CAPILLARY
Glucose-Capillary: 106 mg/dL — ABNORMAL HIGH (ref 70–99)
Glucose-Capillary: 112 mg/dL — ABNORMAL HIGH (ref 70–99)
Glucose-Capillary: 114 mg/dL — ABNORMAL HIGH (ref 70–99)
Glucose-Capillary: 127 mg/dL — ABNORMAL HIGH (ref 70–99)
Glucose-Capillary: 95 mg/dL (ref 70–99)
Glucose-Capillary: 116 mg/dL — ABNORMAL HIGH (ref 70–99)

## 2021-01-25 LAB — BLOOD CULTURE ID PANEL (REFLEXED) - BCID2

## 2021-01-25 LAB — BASIC METABOLIC PANEL
Anion gap: 8 (ref 5–15)
BUN: 6 mg/dL (ref 6–20)
CO2: 25 mmol/L (ref 22–32)
Calcium: 8.4 mg/dL — ABNORMAL LOW (ref 8.9–10.3)
Chloride: 106 mmol/L (ref 98–111)
Creatinine, Ser: 0.59 mg/dL (ref 0.44–1.00)
GFR, Estimated: >60 mL/min (ref >60)
Glucose, Bld: 101 mg/dL — ABNORMAL HIGH (ref 70–99)
Potassium: 2.8 mmol/L — ABNORMAL LOW (ref 3.5–5.1)
Sodium: 139 mmol/L (ref 135–145)

## 2021-01-25 LAB — CSF CULTURE W GRAM STAIN: Culture: NO GROWTH

## 2021-01-25 LAB — CBC
HCT: 27.9 % — ABNORMAL LOW (ref 36.0–46.0)
Hemoglobin: 8.6 g/dL — ABNORMAL LOW (ref 12.0–15.0)
MCH: 27.8 pg (ref 26.0–34.0)
MCHC: 30.8 g/dL (ref 30.0–36.0)
MCV: 90.3 fL (ref 80.0–100.0)
Platelets: 452 10*3/uL — ABNORMAL HIGH (ref 150–400)
RBC: 3.09 MIL/uL — ABNORMAL LOW (ref 3.87–5.11)
RDW: 16.1 % — ABNORMAL HIGH (ref 11.5–15.5)
WBC: 7.2 10*3/uL (ref 4.0–10.5)
nRBC: 0.3 % — ABNORMAL HIGH (ref 0.0–0.2)

## 2021-01-25 LAB — URINE CULTURE: Culture: NO GROWTH

## 2021-01-25 LAB — NA AND K (SODIUM & POTASSIUM), RAND UR
Potassium Urine: 6 mmol/L
Sodium, Ur: 37 mmol/L

## 2021-01-25 LAB — IGG CSF INDEX
Albumin CSF-mCnc: 15 mg/dL (ref 8–37)
Albumin: 3.6 g/dL — ABNORMAL LOW (ref 3.8–4.8)
CSF IgG Index: 0.7 (ref 0.0–0.7)
IgG (Immunoglobin G), Serum: 1078 mg/dL (ref 586–1602)
IgG, CSF: 3.3 mg/dL (ref 0.0–6.7)
IgG/Alb Ratio, CSF: 0.22 (ref 0.00–0.25)

## 2021-01-25 LAB — MISC LABCORP TEST (SEND OUT): Labcorp test code: 9985

## 2021-01-25 MED ORDER — POTASSIUM CHLORIDE CRYS ER 20 MEQ PO TBCR
40.0000 meq | EXTENDED_RELEASE_TABLET | ORAL | Status: AC
  Administered 2021-01-25 (×3): 40 meq via ORAL
  Filled 2021-01-25 (×3): qty 2

## 2021-01-25 MED ORDER — INSULIN ASPART 100 UNIT/ML ~~LOC~~ SOLN: 0.0000 [IU] | Freq: Three times a day (TID) | SUBCUTANEOUS | Status: DC

## 2021-01-25 MED ORDER — FUROSEMIDE 20 MG PO TABS
20.0000 mg | ORAL_TABLET | Freq: Two times a day (BID) | ORAL | Status: DC
  Administered 2021-01-25 – 2021-01-26 (×3): 20 mg via ORAL
  Filled 2021-01-25 (×3): qty 1

## 2021-01-25 MED ORDER — POTASSIUM CHLORIDE CRYS ER 20 MEQ PO TBCR
20.0000 meq | EXTENDED_RELEASE_TABLET | Freq: Every day | ORAL | Status: DC
  Administered 2021-01-26: 20 meq via ORAL
  Filled 2021-01-25: qty 1

## 2021-01-25 MED ORDER — SPIRONOLACTONE 25 MG PO TABS
50.0000 mg | ORAL_TABLET | Freq: Every morning | ORAL | Status: DC
  Administered 2021-01-25 – 2021-01-26 (×2): 50 mg via ORAL
  Filled 2021-01-25 (×3): qty 2

## 2021-01-25 NOTE — Progress Notes (Signed)
Mobility Specialist: Progress Note   01/25/21 1500  Mobility  Activity Ambulated in hall  Level of Assistance Modified independent, requires aide device or extra time  Assistive Device None  Distance Ambulated (ft) 440 ft  Mobility Response Tolerated well  Mobility performed by Mobility specialist  Bed Position Chair  $Mobility charge 1 Mobility   During Mobility: 138 HR Post-Mobility: 117 HR  Pt said her legs felt a little weak during ambulation but otherwise asx. Pt experience LOB x1 she said was due to her looking at the ground, recovered independently and quickly. Pt to chair after walk per request.   Isabella Moore Mobility Specialist Mobility Specialist Phone: [phone]
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
## 2021-01-25 NOTE — Progress Notes (Addendum)
PHARMACY - PHYSICIAN COMMUNICATION CRITICAL VALUE ALERT - BLOOD CULTURE IDENTIFICATION (BCID)  Jaydin Jalomo is an 45 y.o. female who presented to [facility] on 01/20/2021.   Assessment: 1/4 s. Epi, no resistance detected - possible contaminant.   Name of physician (or Provider) Contacted: Dr. Avon Gully  Current antibiotics: off antibiotics - ceftriaxone and azithromycin stopped yesterday  Changes to prescribed antibiotics recommended: Likely a contaminant with no fever or leukocytosis. Recommendations accepted by provider  No results found for this or any previous visit.  Cristela Felt, PharmD Clinical Pharmacist  01/25/2021  7:48 AM

## 2021-01-25 NOTE — Evaluation (Signed)
Occupational Therapy Evaluation Patient Details Name: Isabella Moore MRN: [id number] DOB: 17-Sep-1976 Today's Date: 01/25/2021    History of Present Illness pt is 45 y/o female presenting with uncontrolled generalized body movements, redness and pain of her recent R TKA.  PMHx:  insomnia, OSA, RLS.   Clinical Impression   Patient admitted for the diagnosis above.  Patient is subjectively feeling better, sleeping more and is not experiencing any uncontrolled movements.  She is at or near her baseline for self care, mobility and toileting.  No further OT is warranted in the acute or post acute setting.  Recommend follow care as prescribed by MD.  Patient is okay to mobilize in the halls with family.      Follow Up Recommendations  No OT follow up    Equipment Recommendations  None recommended by OT    Recommendations for Other Services       Precautions / Restrictions Precautions Precautions: Fall Restrictions Other Position/Activity Restrictions: WBAT      Mobility Bed Mobility Overal bed mobility: Modified Independent               Patient Response: Cooperative  Transfers Overall transfer level: Modified independent Equipment used: Rolling walker (2 wheeled)             General transfer comment: not using RW in the room.    Balance Overall balance assessment: Mild deficits observed, not formally tested                                         ADL either performed or assessed with clinical judgement   ADL Overall ADL's : Modified independent                                             Vision Patient Visual Report: No change from baseline       Perception     Praxis      Pertinent Vitals/Pain Pain Score: 2  Pain Location: right knee Pain Descriptors / Indicators: Sore;Aching Pain Intervention(s): Monitored during session     Hand Dominance Right   Extremity/Trunk Assessment Upper Extremity  Assessment Upper Extremity Assessment: Overall WFL for tasks assessed   Lower Extremity Assessment Lower Extremity Assessment: Defer to PT evaluation   Cervical / Trunk Assessment Cervical / Trunk Assessment: Normal   Communication Communication Communication: No difficulties   Cognition Arousal/Alertness: Awake/alert Behavior During Therapy: WFL for tasks assessed/performed Overall Cognitive Status: Within Functional Limits for tasks assessed                                       Home Living Family/patient expects to be discharged to:: Private residence Living Arrangements: Spouse/significant other Available Help at Discharge: Family Type of Home: House Home Access: Stairs to enter Technical brewer of Steps: 2 Entrance Stairs-Rails: None Home Layout: One level     Bathroom Shower/Tub: Teacher, early years/pre: Standard Bathroom Accessibility: Yes   Home Equipment: Toilet riser;Shower seat;Walker - 2 wheels          Prior Functioning/Environment Level of Independence: Independent                 OT Problem  List: Pain;Impaired balance (sitting and/or standing)      OT Treatment/Interventions:      OT Goals(Current goals can be found in the care plan section) Acute Rehab OT Goals Patient Stated Goal: To return home and recover OT Goal Formulation: With patient Time For Goal Achievement: 01/25/21 Potential to Achieve Goals: Good  OT Frequency:     Barriers to D/C:   none noted         Co-evaluation              AM-PAC OT "6 Clicks" Daily Activity     Outcome Measure Help from another person eating meals?: None Help from another person taking care of personal grooming?: None Help from another person toileting, which includes using toliet, bedpan, or urinal?: None Help from another person bathing (including washing, rinsing, drying)?: None Help from another person to put on and taking off regular upper body  clothing?: None Help from another person to put on and taking off regular lower body clothing?: None 6 Click Score: 24   End of Session    Activity Tolerance: Patient tolerated treatment well Patient left: in bed;with call bell/phone within reach;with family/visitor present  OT Visit Diagnosis: Unsteadiness on feet (R26.81);Pain Pain - Right/Left: Right Pain - part of body: Knee                Time: 1512-1530 OT Time Calculation (min): 18 min Charges:  OT General Charges $OT Visit: 1 Visit OT Evaluation $OT Eval Low Complexity: 1 Low  01/25/2021  Rich, OTR/L  Acute Rehabilitation Services  Office:  [phone]   Metta Clines 01/25/2021, 4:03 PM

## 2021-01-25 NOTE — Progress Notes (Signed)
Pt ambulated in hallway 42ft with walker. Pt had steady gait and tolerated well. Returned to recliner with call light in reach.  Clyde Canterbury, RN

## 2021-01-26 LAB — BASIC METABOLIC PANEL
Anion gap: 7 (ref 5–15)
BUN: 5 mg/dL — ABNORMAL LOW (ref 6–20)
CO2: 25 mmol/L (ref 22–32)
Calcium: 8.4 mg/dL — ABNORMAL LOW (ref 8.9–10.3)
Chloride: 107 mmol/L (ref 98–111)
Creatinine, Ser: 0.5 mg/dL (ref 0.44–1.00)
GFR, Estimated: >60 mL/min (ref >60)
Glucose, Bld: 88 mg/dL (ref 70–99)
Potassium: 4.1 mmol/L (ref 3.5–5.1)
Sodium: 139 mmol/L (ref 135–145)

## 2021-01-26 LAB — CBC
HCT: 31 % — ABNORMAL LOW (ref 36.0–46.0)
Hemoglobin: 9.5 g/dL — ABNORMAL LOW (ref 12.0–15.0)
MCH: 27.7 pg (ref 26.0–34.0)
MCHC: 30.6 g/dL (ref 30.0–36.0)
MCV: 90.4 fL (ref 80.0–100.0)
Platelets: 387 10*3/uL (ref 150–400)
RBC: 3.43 MIL/uL — ABNORMAL LOW (ref 3.87–5.11)
RDW: 15.6 % — ABNORMAL HIGH (ref 11.5–15.5)
WBC: 5.9 10*3/uL (ref 4.0–10.5)
nRBC: 0 % (ref 0.0–0.2)

## 2021-01-26 MED ORDER — FUROSEMIDE 20 MG PO TABS: 20.0000 mg | ORAL_TABLET | Freq: Two times a day (BID) | ORAL | 0 refills | Status: AC

## 2021-01-26 MED ORDER — POTASSIUM CHLORIDE CRYS ER 20 MEQ PO TBCR: 20.0000 meq | EXTENDED_RELEASE_TABLET | Freq: Every day | ORAL | 0 refills | Status: AC

## 2021-01-26 NOTE — Discharge Summary (Signed)
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 patient history, and epidemiological information. The expected result is Negative.  Fact Sheet for Patients: SugarRoll.be  Fact Sheet for Healthcare Providers: https://www.woods-mathews.com/  This test is not yet approved or cleared by the [place] FDA and  has been authorized for detection and/or diagnosis of SARS-CoV-2 by FDA under an Emergency Use Authorization (EUA). This EUA will remain  in effect (meaning this test can be used) for the duration of the COVID-19 declaration under Se ction 564(b)(1) of the Act, 21 U.S.C. section 360bbb-3(b)(1), unless the authorization is  terminated or revoked sooner.  Performed at [facility], [place] [address], [place], [place] [address]   CSF culture     Status: None   Collection Time: 01/22/21  1:32 PM   Specimen: CSF; Cerebrospinal Fluid  Result Value Ref Range Status   Specimen Description CSF  Final   Special Requests NONE  Final   Gram Stain   Final    WBC PRESENT, PREDOMINANTLY MONONUCLEAR NO ORGANISMS SEEN CYTOSPIN SMEAR    Culture   Final    NO GROWTH 3 DAYS Performed at [facility], [place] [address] [place], [place] [address]    Report Status 01/25/2021 FINAL  Final  Culture, Urine     Status: None   Collection Time: 01/23/21  9:22 PM   Specimen: Urine, Random  Result Value Ref Range Status  Specimen Description URINE, RANDOM  Final   Special Requests NONE  Final   Culture   Final    NO GROWTH Performed at [facility], [place] [address], [place], [place] [address]    Report Status 01/25/2021 FINAL  Final  Culture, blood (routine x 2)     Status: Abnormal (Preliminary result)   Collection Time: 01/23/21 10:09 PM   Specimen: BLOOD RIGHT HAND  Result Value Ref Range Status   Specimen Description BLOOD RIGHT HAND  Final   Special Requests   Final    BOTTLES DRAWN AEROBIC AND ANAEROBIC Blood Culture adequate volume   Culture  Setup Time   Final    AEROBIC BOTTLE ONLY GRAM POSITIVE COCCI CRITICAL RESULT CALLED TO, READ BACK BY AND VERIFIED WITH: PHARMD  M YAPES [id number] AT 19 AM BY CM    Culture (A)  Final    STAPHYLOCOCCUS EPIDERMIDIS THE SIGNIFICANCE OF ISOLATING THIS ORGANISM FROM A SINGLE SET OF BLOOD CULTURES WHEN MULTIPLE SETS ARE DRAWN IS UNCERTAIN. PLEASE NOTIFY THE MICROBIOLOGY DEPARTMENT WITHIN ONE WEEK IF SPECIATION AND SENSITIVITIES ARE REQUIRED. Performed at [facility], [place] [address] [place],  [address]    Report Status PENDING  Incomplete  Blood Culture ID Panel (Reflexed)     Status: Abnormal   Collection Time: 01/23/21 10:09 PM  Result Value Ref  Range Status   Enterococcus faecalis NOT DETECTED NOT DETECTED Final   Enterococcus Faecium NOT DETECTED NOT DETECTED Final   Listeria monocytogenes NOT DETECTED NOT DETECTED Final   Staphylococcus species DETECTED (A) NOT DETECTED Final    Comment: CRITICAL RESULT CALLED TO, READ BACK BY AND VERIFIED WITH: PHARMD M YAOES [id number] AT 751 AM BY CM    Staphylococcus aureus (BCID) NOT DETECTED NOT DETECTED Final   Staphylococcus epidermidis DETECTED (A) NOT DETECTED Final    Comment: CRITICAL RESULT CALLED TO, READ BACK BY AND VERIFIED WITH: PHARMD M YAPES [id number] AT 751 AM BY CM    Staphylococcus lugdunensis NOT DETECTED NOT DETECTED Final   Streptococcus species NOT DETECTED NOT DETECTED Final   Streptococcus agalactiae NOT DETECTED NOT DETECTED Final   Streptococcus pneumoniae NOT DETECTED NOT DETECTED Final   Streptococcus pyogenes NOT DETECTED NOT DETECTED Final   A.calcoaceticus-baumannii NOT DETECTED NOT DETECTED Final   Bacteroides fragilis NOT DETECTED NOT DETECTED Final   Enterobacterales NOT DETECTED NOT DETECTED Final   Enterobacter cloacae complex NOT DETECTED NOT DETECTED Final   Escherichia coli NOT DETECTED NOT DETECTED Final   Klebsiella aerogenes NOT DETECTED NOT DETECTED Final   Klebsiella oxytoca NOT DETECTED NOT DETECTED Final   Klebsiella pneumoniae NOT DETECTED NOT DETECTED Final   Proteus species NOT DETECTED NOT DETECTED Final   Salmonella species NOT DETECTED NOT DETECTED Final   Serratia marcescens NOT DETECTED NOT DETECTED Final   Haemophilus influenzae NOT DETECTED NOT DETECTED Final   Neisseria meningitidis NOT DETECTED NOT DETECTED Final   Pseudomonas aeruginosa NOT DETECTED NOT DETECTED Final   Stenotrophomonas maltophilia NOT DETECTED NOT DETECTED Final   Candida albicans NOT DETECTED NOT DETECTED Final   Candida auris NOT DETECTED NOT DETECTED Final   Candida glabrata NOT DETECTED NOT DETECTED Final   Candida krusei NOT DETECTED NOT DETECTED Final    Candida parapsilosis NOT DETECTED NOT DETECTED Final   Candida tropicalis NOT DETECTED NOT DETECTED Final   Cryptococcus neoformans/gattii NOT DETECTED NOT DETECTED Final   Methicillin resistance mecA/C NOT DETECTED NOT DETECTED Final    Comment: Performed at  [facility], [place] [address] [place], [place] [address]  Culture, blood (routine x 2)     Status: None (Preliminary result)   Collection Time: 01/23/21 10:26 PM   Specimen: BLOOD LEFT HAND  Result Value Ref Range Status   Specimen Description BLOOD LEFT HAND  Final   Special Requests AEROBIC BOTTLE ONLY Blood Culture adequate volume  Final   Culture   Final    NO GROWTH 2 DAYS Performed at [facility], [place] [address], [place], [place] [address]    Report Status PENDING  Incomplete     Time coordinating discharge: Over 30 minutes  SIGNED:   Little Ishikawa, DO [facility] 01/26/2021, 5:07 PM Pager   If 7PM-7AM, please contact night-coverage [email or web address]

## 2021-01-26 NOTE — Plan of Care (Signed)

## 2021-01-26 NOTE — Progress Notes (Signed)
Patient d/c from unit. Discharge instructions given and education completed.    

## 2021-01-26 NOTE — Progress Notes (Signed)
BS is not uploading in the chart but was 96 this am

## 2021-01-26 NOTE — Progress Notes (Signed)
Mobility Specialist: Progress Note   01/26/21 1035  Mobility  Activity Ambulated in hall  Level of Assistance Standby assist, set-up cues, supervision of patient - no hands on  Assistive Device Four wheel walker  Distance Ambulated (ft) 1000 ft  Mobility Response Tolerated well  Mobility performed by Mobility specialist  $Mobility charge 1 Mobility   Pre-Mobility: 111 HR During Mobility: 131 HR Post-Mobility: 115 HR  Pt c/o feeling a little winded after returning to the room but otherwise asx. Pt sitting EOB with family member in room.   [facility] Marcey Persad Mobility Specialist Mobility Specialist Phone: [phone]

## 2021-01-27 LAB — CULTURE, BLOOD (ROUTINE X 2): Special Requests: ADEQUATE

## 2021-01-28 LAB — COPPER, URINE - RANDOM OR 24 HOUR
Copper / Creatinine Ratio: 24 ug/g creat (ref 0–49)
Copper, 24H Ur: 20 ug/24 hr (ref 3–35)
Copper, Ur: 20 ug/L
Creatinine(Crt),U: 0.84 g/L (ref 0.30–3.00)
Total Volume: 1000

## 2021-01-28 LAB — OLIGOCLONAL BANDS, CSF + SERM

## 2021-01-28 LAB — GLUCOSE, CAPILLARY
Glucose-Capillary: 96 mg/dL (ref 70–99)
Glucose-Capillary: 94 mg/dL (ref 70–99)
Glucose-Capillary: 96 mg/dL (ref 70–99)

## 2021-01-29 LAB — CULTURE, BLOOD (ROUTINE X 2)
Culture: NO GROWTH
Special Requests: ADEQUATE

## 2021-01-29 LAB — METHYLMALONIC ACID, SERUM: Methylmalonic Acid, Quantitative: 119 nmol/L (ref 0–378)

## 2021-02-05 ENCOUNTER — Ambulatory Visit: Payer: Medicare (Managed Care) | Admitting: Physician Assistant

## 2021-02-12 LAB — MISC LABCORP TEST (SEND OUT): Labcorp test code: 9985

## 2022-03-31 IMAGING — DX DG KNEE 1-2V*R*
2 series · 2 of 2 positions shown · non-contrast
Comparison: None.

CLINICAL DATA: Knee replacement.  Pain.

EXAM:
RIGHT KNEE - 1-2 VIEW

[knee ap]
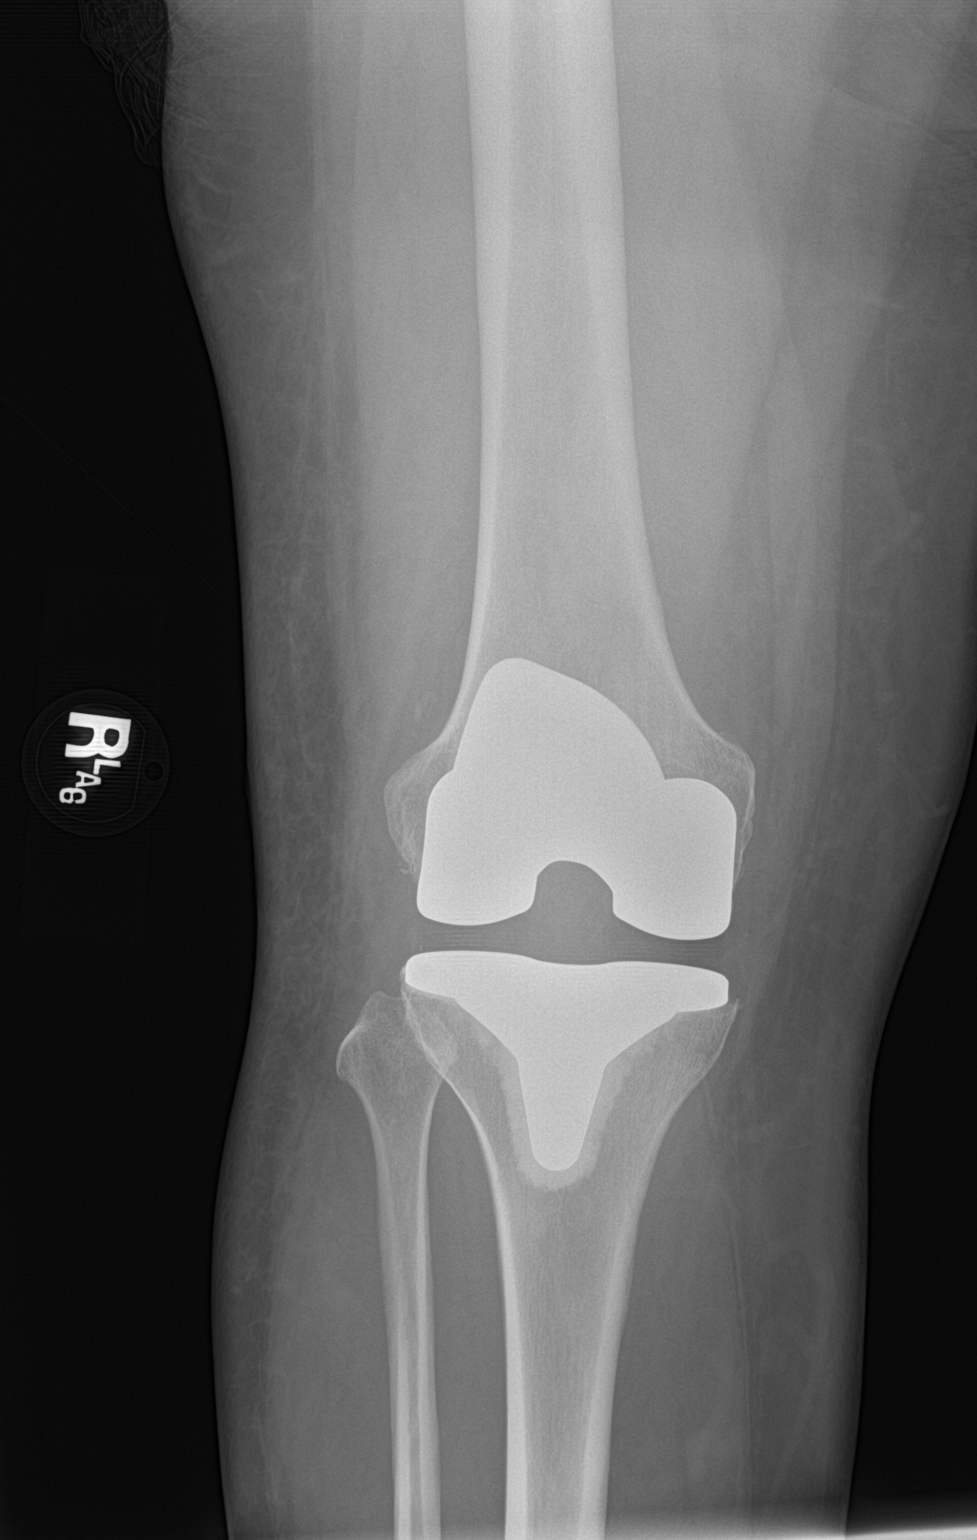

[knee lat]
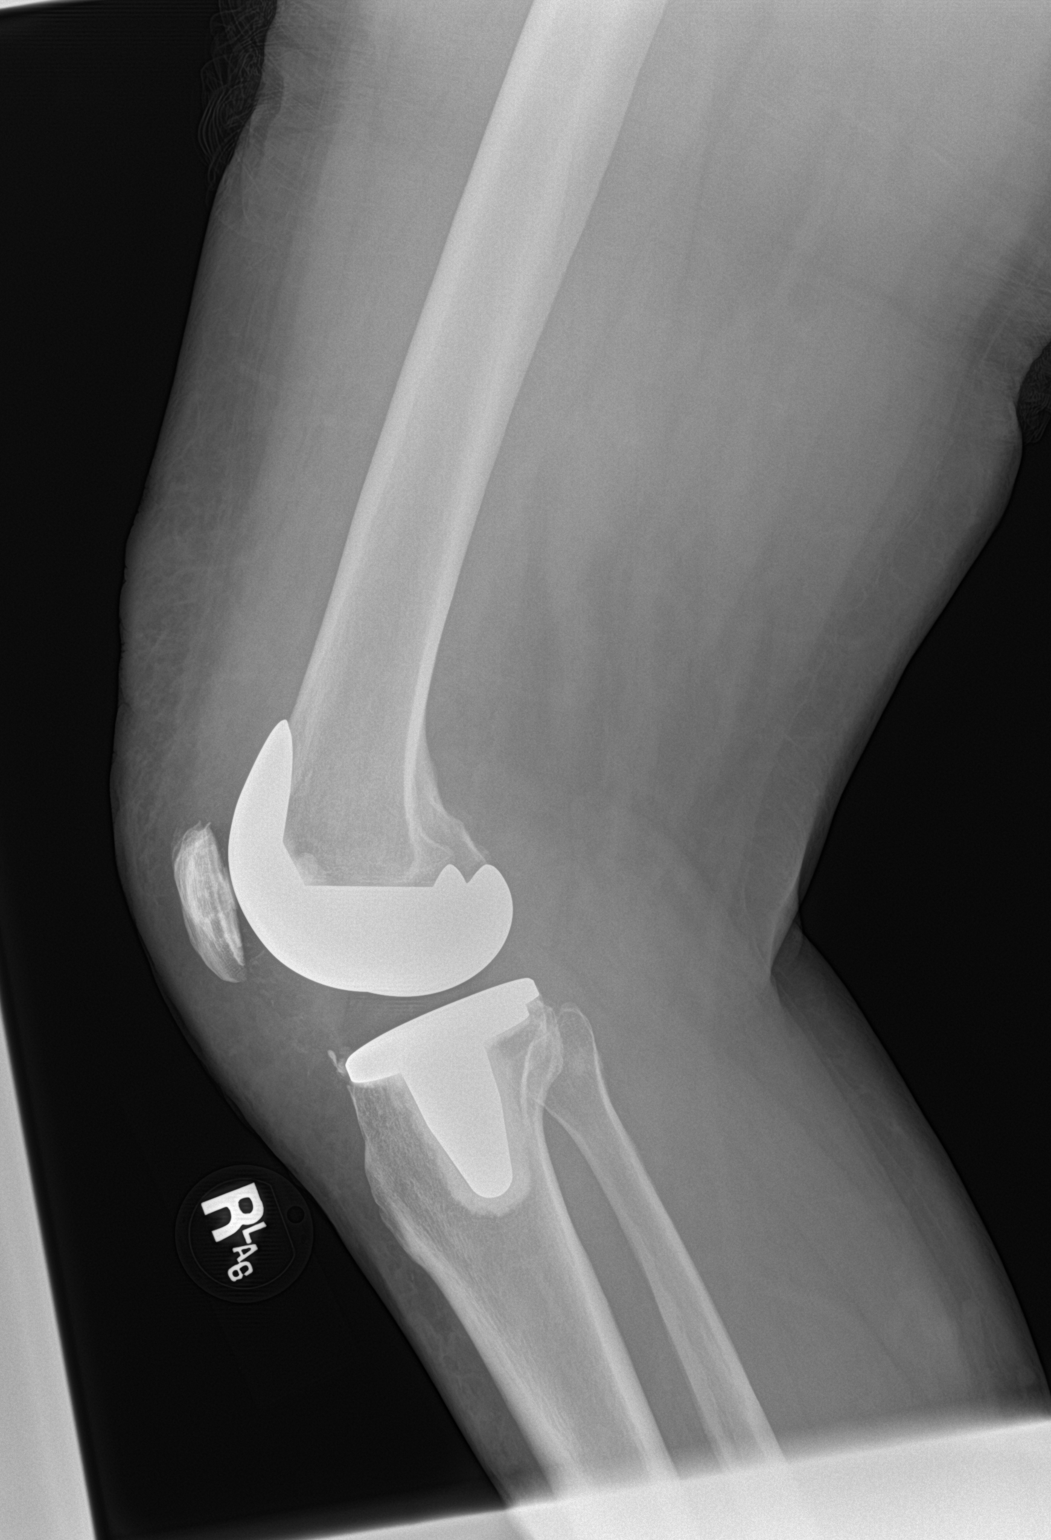

[2 of 2 positions shown; findings below may reference images not displayed]

FINDINGS: Changes of right knee replacement. No hardware complicating feature.
No visible joint effusion. No acute bony abnormality. Specifically,
no fracture, subluxation, or dislocation.
IMPRESSION: Right knee replacement.  No acute findings.

## 2022-04-01 IMAGING — XA IR FLUORO GUIDE NDL PLMT / BX
2 series · 2 of 2 positions shown · non-contrast
Comparison: None.

CLINICAL DATA: 44-year-old female with new onset indeterminate
neurologic disorder.

EXAM:
DIAGNOSTIC LUMBAR PUNCTURE UNDER FLUOROSCOPIC GUIDANCE

[Series 1: fl (-) angio · 1 of 1 slices shown (1 of 2)]
[im 1/1]
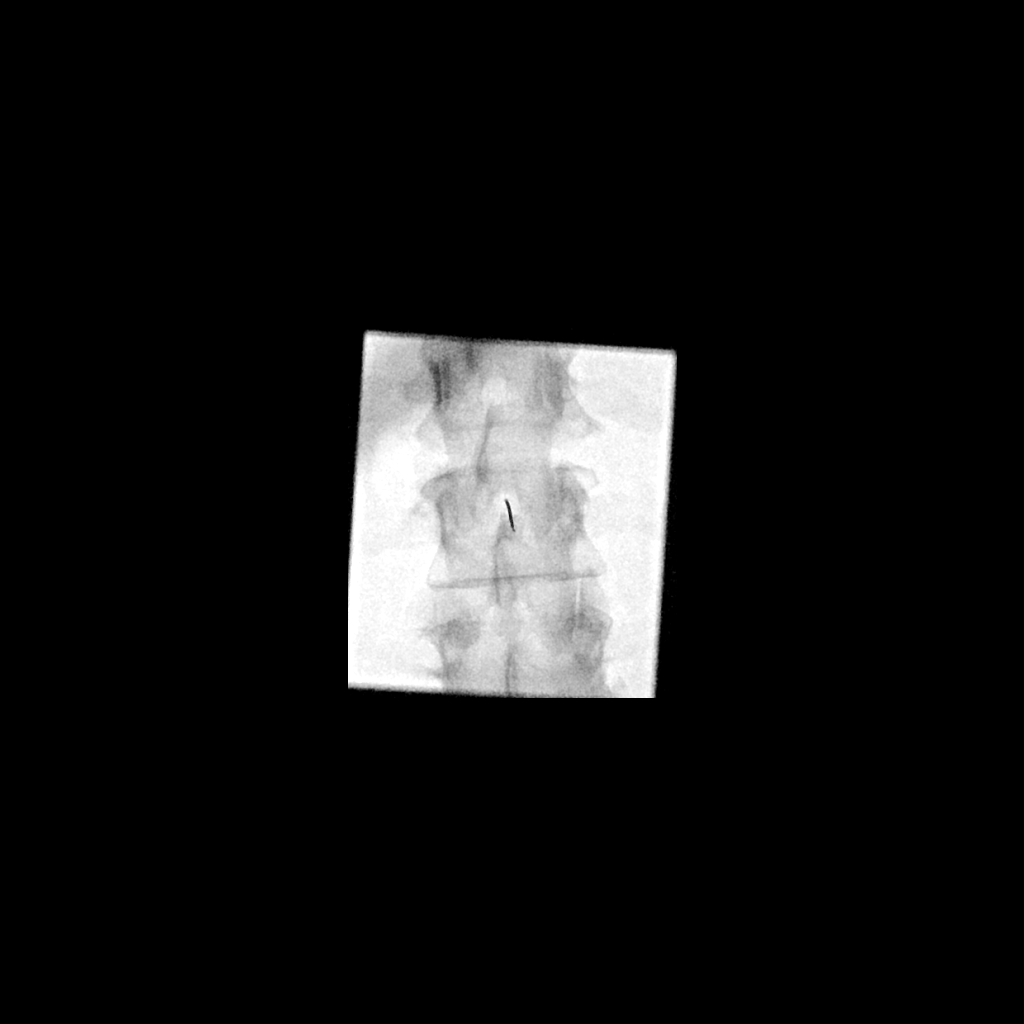

[Series 2: fl (-) angio · 1 of 1 slices shown (2 of 2)]
[im 1/1]
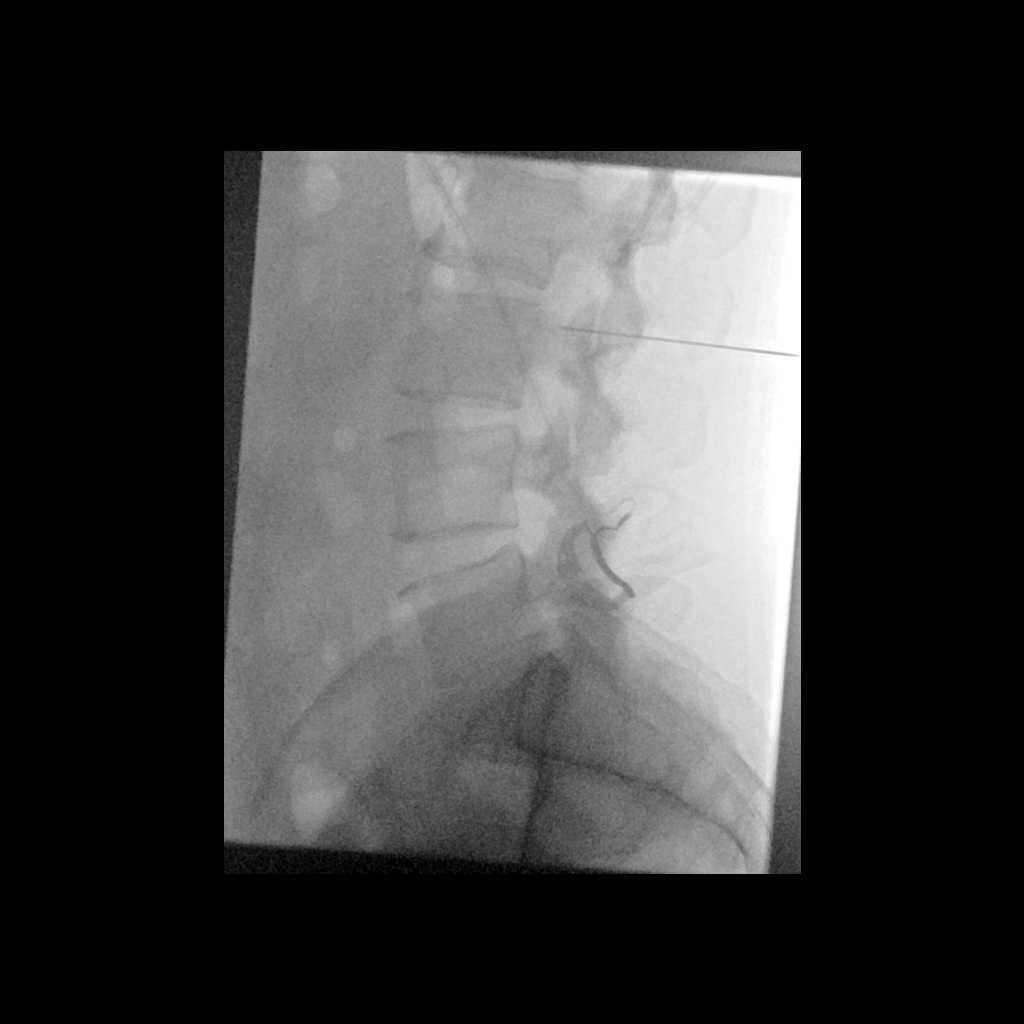

[2 of 2 positions shown; findings below may reference images not displayed]

FLUOROSCOPY TIME:  Fluoroscopy Time:  30 seconds

Radiation Exposure Index (if provided by the fluoroscopic device): 3
mGy

Number of Acquired Spot Images: 2

PROCEDURE:
Informed consent was obtained from the patient's husband prior to
the procedure, including potential complications of headache,
allergy, and pain. With the patient prone, the lower back was
prepped with Betadine. 1% Lidocaine was used for local anesthesia.
Lumbar puncture was performed at the L2-L3 level using a 3.5 inch,
20 gauge needle with return of clear CSF with an opening pressure
that was unable to be obtained. Nineteen ml of CSF were obtained for
laboratory studies. The patient tolerated the procedure well and
there were no apparent complications.
IMPRESSION: Technically successful fluoroscopic guided lumbar puncture yielding
19 mL of clear cerebrospinal fluid for laboratory studies.

## 2022-04-02 IMAGING — DX DG CHEST 1V
1 series · 1 of 1 positions shown · non-contrast
Comparison: None.

CLINICAL DATA: Pneumonia

EXAM:
CHEST  1 VIEW

[chest]
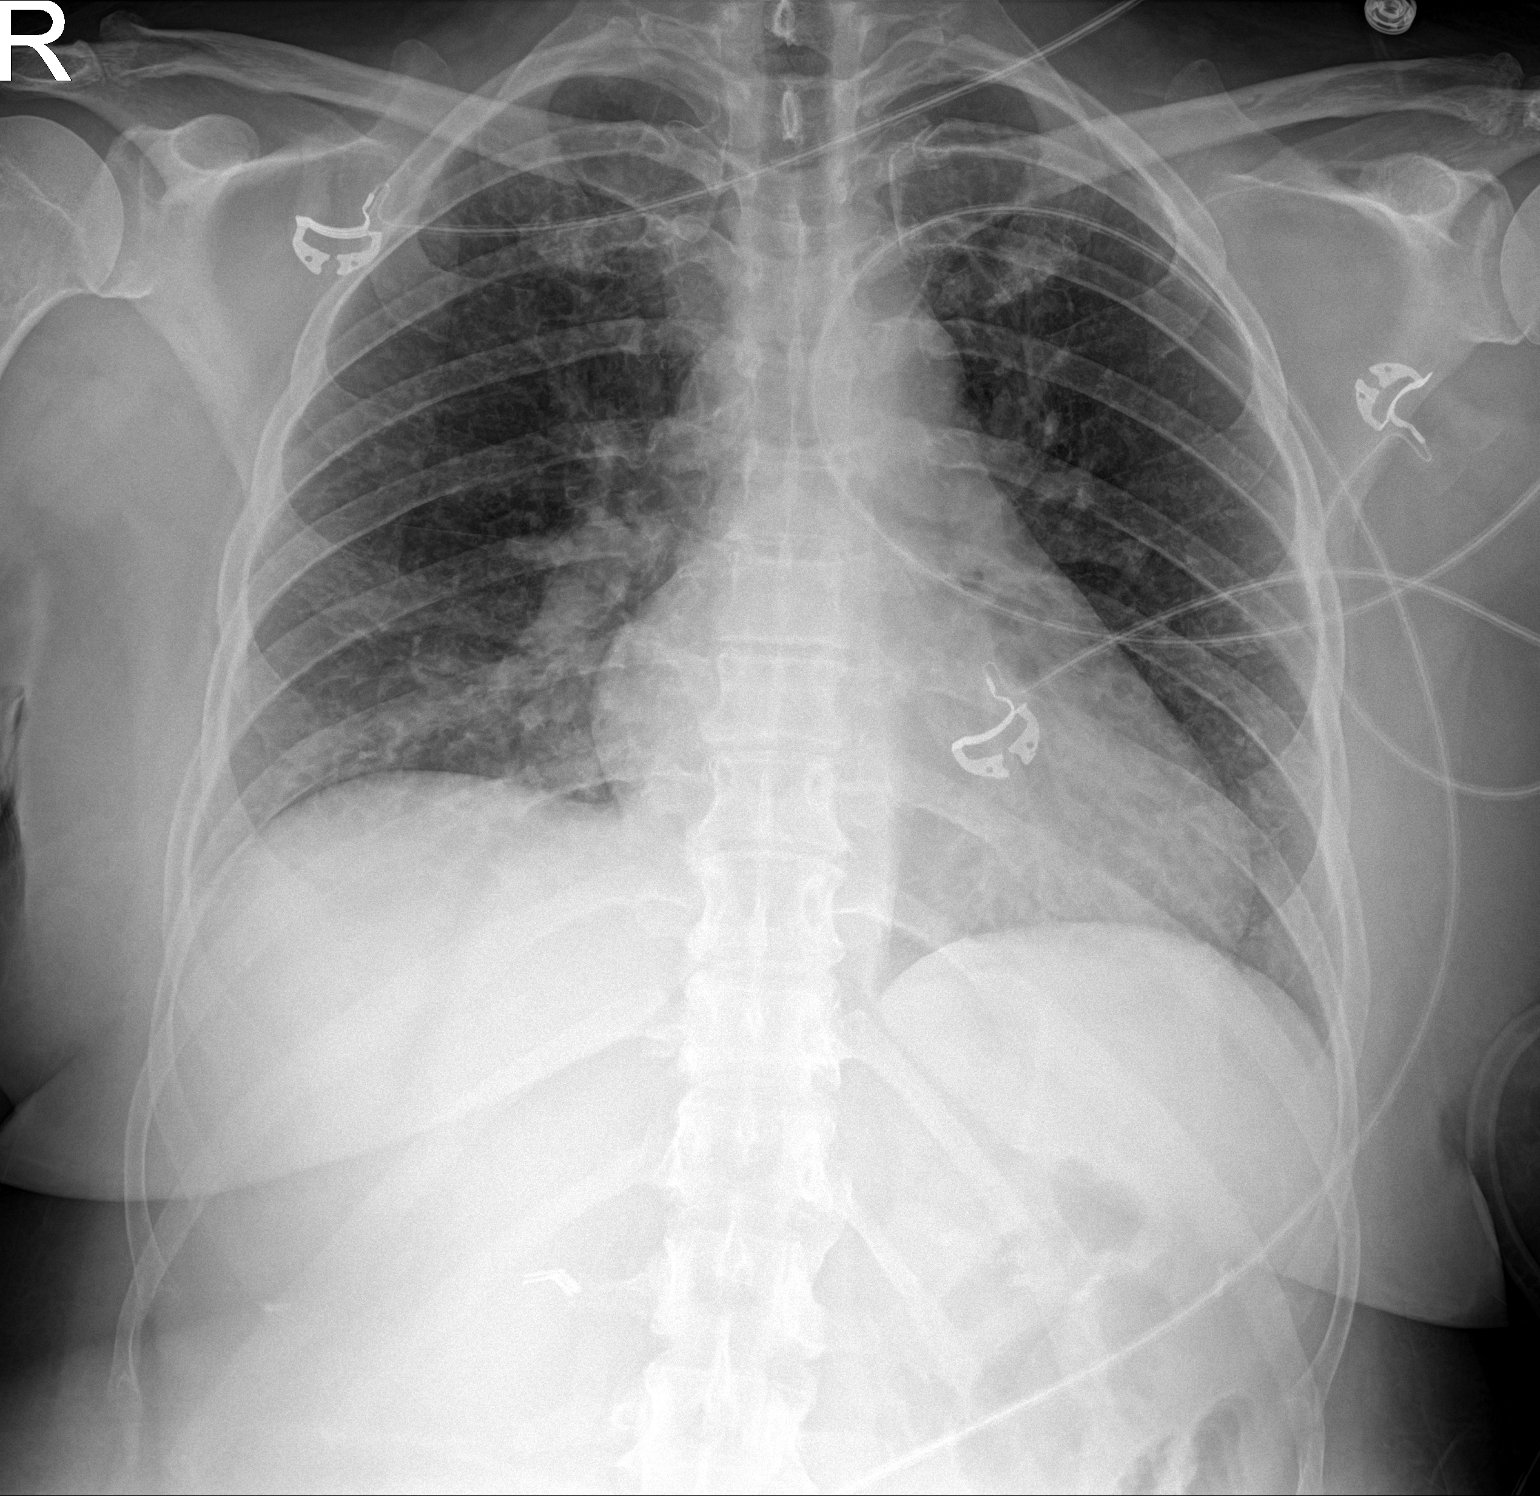

[1 of 1 positions shown; findings below may reference images not displayed]

FINDINGS: Mild right basilar opacity. Lungs are otherwise clear. No pleural
effusion or pneumothorax. Normal cardiomediastinal contours.
IMPRESSION: Mild right basilar opacity may indicate infection.
